# Patient Record
Sex: Male | Born: 1965 | Race: White | Hispanic: No | Marital: Married | Smoking: Former smoker
Health system: Southern US, Community
[De-identification: ages and names within clinical notes are randomized; demographics above are authoritative.]

## PROBLEM LIST (undated history)

## (undated) DIAGNOSIS — E785 Hyperlipidemia, unspecified: Secondary | ICD-10-CM

## (undated) DIAGNOSIS — I2510 Atherosclerotic heart disease of native coronary artery without angina pectoris: Secondary | ICD-10-CM

## (undated) DIAGNOSIS — K219 Gastro-esophageal reflux disease without esophagitis: Secondary | ICD-10-CM

## (undated) DIAGNOSIS — I10 Essential (primary) hypertension: Secondary | ICD-10-CM

## (undated) DIAGNOSIS — D649 Anemia, unspecified: Secondary | ICD-10-CM

## (undated) HISTORY — DX: Hyperlipidemia, unspecified: E78.5

## (undated) HISTORY — PX: NO PAST SURGERIES: SHX2092

## (undated) HISTORY — DX: Gastro-esophageal reflux disease without esophagitis: K21.9

## (undated) HISTORY — DX: Atherosclerotic heart disease of native coronary artery without angina pectoris: I25.10

## (undated) HISTORY — DX: Anemia, unspecified: D64.9

---
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
## 2012-02-01 DIAGNOSIS — Z8249 Family history of ischemic heart disease and other diseases of the circulatory system: Secondary | ICD-10-CM | POA: Insufficient documentation

## 2013-09-29 DIAGNOSIS — R931 Abnormal findings on diagnostic imaging of heart and coronary circulation: Secondary | ICD-10-CM | POA: Insufficient documentation

## 2016-04-01 DIAGNOSIS — R002 Palpitations: Secondary | ICD-10-CM | POA: Diagnosis not present

## 2016-04-01 DIAGNOSIS — R931 Abnormal findings on diagnostic imaging of heart and coronary circulation: Secondary | ICD-10-CM | POA: Diagnosis not present

## 2016-04-01 DIAGNOSIS — Z0000 Encounter for general adult medical examination without abnormal findings: Secondary | ICD-10-CM | POA: Diagnosis not present

## 2016-04-01 DIAGNOSIS — E7800 Pure hypercholesterolemia, unspecified: Secondary | ICD-10-CM | POA: Diagnosis not present

## 2016-06-19 DIAGNOSIS — E784 Other hyperlipidemia: Secondary | ICD-10-CM | POA: Diagnosis not present

## 2016-06-19 DIAGNOSIS — Z1389 Encounter for screening for other disorder: Secondary | ICD-10-CM | POA: Diagnosis not present

## 2016-06-19 DIAGNOSIS — I2584 Coronary atherosclerosis due to calcified coronary lesion: Secondary | ICD-10-CM | POA: Diagnosis not present

## 2016-06-19 DIAGNOSIS — Z6824 Body mass index (BMI) 24.0-24.9, adult: Secondary | ICD-10-CM | POA: Diagnosis not present

## 2017-02-17 DIAGNOSIS — Z0000 Encounter for general adult medical examination without abnormal findings: Secondary | ICD-10-CM | POA: Diagnosis not present

## 2017-02-17 DIAGNOSIS — E7849 Other hyperlipidemia: Secondary | ICD-10-CM | POA: Diagnosis not present

## 2017-02-17 DIAGNOSIS — Z125 Encounter for screening for malignant neoplasm of prostate: Secondary | ICD-10-CM | POA: Diagnosis not present

## 2017-02-24 DIAGNOSIS — F5221 Male erectile disorder: Secondary | ICD-10-CM | POA: Diagnosis not present

## 2017-02-24 DIAGNOSIS — Z1389 Encounter for screening for other disorder: Secondary | ICD-10-CM | POA: Diagnosis not present

## 2017-02-24 DIAGNOSIS — Z0000 Encounter for general adult medical examination without abnormal findings: Secondary | ICD-10-CM | POA: Diagnosis not present

## 2017-02-24 DIAGNOSIS — R7301 Impaired fasting glucose: Secondary | ICD-10-CM | POA: Diagnosis not present

## 2017-02-24 DIAGNOSIS — E7849 Other hyperlipidemia: Secondary | ICD-10-CM | POA: Diagnosis not present

## 2017-02-24 DIAGNOSIS — I2584 Coronary atherosclerosis due to calcified coronary lesion: Secondary | ICD-10-CM | POA: Diagnosis not present

## 2017-04-26 DIAGNOSIS — H5213 Myopia, bilateral: Secondary | ICD-10-CM | POA: Diagnosis not present

## 2017-09-16 DIAGNOSIS — R002 Palpitations: Secondary | ICD-10-CM | POA: Diagnosis not present

## 2017-09-16 DIAGNOSIS — F5221 Male erectile disorder: Secondary | ICD-10-CM | POA: Diagnosis not present

## 2017-09-16 DIAGNOSIS — I2584 Coronary atherosclerosis due to calcified coronary lesion: Secondary | ICD-10-CM | POA: Diagnosis not present

## 2017-09-16 DIAGNOSIS — R7301 Impaired fasting glucose: Secondary | ICD-10-CM | POA: Diagnosis not present

## 2017-09-16 DIAGNOSIS — E7849 Other hyperlipidemia: Secondary | ICD-10-CM | POA: Diagnosis not present

## 2017-12-22 DIAGNOSIS — Z6827 Body mass index (BMI) 27.0-27.9, adult: Secondary | ICD-10-CM | POA: Diagnosis not present

## 2017-12-22 DIAGNOSIS — E7849 Other hyperlipidemia: Secondary | ICD-10-CM | POA: Diagnosis not present

## 2017-12-22 DIAGNOSIS — R002 Palpitations: Secondary | ICD-10-CM | POA: Diagnosis not present

## 2017-12-22 DIAGNOSIS — R030 Elevated blood-pressure reading, without diagnosis of hypertension: Secondary | ICD-10-CM | POA: Diagnosis not present

## 2017-12-22 DIAGNOSIS — K219 Gastro-esophageal reflux disease without esophagitis: Secondary | ICD-10-CM | POA: Diagnosis not present

## 2017-12-31 DIAGNOSIS — Z0189 Encounter for other specified special examinations: Secondary | ICD-10-CM | POA: Diagnosis not present

## 2017-12-31 DIAGNOSIS — I10 Essential (primary) hypertension: Secondary | ICD-10-CM | POA: Diagnosis not present

## 2017-12-31 DIAGNOSIS — R0789 Other chest pain: Secondary | ICD-10-CM | POA: Diagnosis not present

## 2017-12-31 DIAGNOSIS — R002 Palpitations: Secondary | ICD-10-CM | POA: Diagnosis not present

## 2018-01-14 DIAGNOSIS — R0789 Other chest pain: Secondary | ICD-10-CM | POA: Diagnosis not present

## 2018-01-14 DIAGNOSIS — R002 Palpitations: Secondary | ICD-10-CM | POA: Diagnosis not present

## 2018-01-19 ENCOUNTER — Other Ambulatory Visit (HOSPITAL_COMMUNITY): Payer: Self-pay | Admitting: Cardiology

## 2018-01-19 DIAGNOSIS — R079 Chest pain, unspecified: Secondary | ICD-10-CM

## 2018-01-19 DIAGNOSIS — R9430 Abnormal result of cardiovascular function study, unspecified: Secondary | ICD-10-CM

## 2018-01-21 DIAGNOSIS — Z6826 Body mass index (BMI) 26.0-26.9, adult: Secondary | ICD-10-CM | POA: Diagnosis not present

## 2018-01-21 DIAGNOSIS — I10 Essential (primary) hypertension: Secondary | ICD-10-CM | POA: Diagnosis not present

## 2018-01-21 DIAGNOSIS — I2510 Atherosclerotic heart disease of native coronary artery without angina pectoris: Secondary | ICD-10-CM | POA: Diagnosis not present

## 2018-01-24 ENCOUNTER — Telehealth (HOSPITAL_COMMUNITY): Payer: Self-pay | Admitting: Emergency Medicine

## 2018-01-24 NOTE — Telephone Encounter (Signed)
error 

## 2018-01-24 NOTE — Telephone Encounter (Signed)
RN NAV called patient to ensure smooth procedure tomorrow; patient not aware to take metoprolol before scan, denies allergies to contrast, denies use of metformin or sexually enhancing drugs; pt states he uses [facility] for prescriptions; pt also instructed not to eat at least 4 hr prior and to drink water up to 1 hr prior; encouraged patient to call me back if any questions came up [place]

## 2018-01-25 ENCOUNTER — Ambulatory Visit (HOSPITAL_COMMUNITY)
Admission: RE | Admit: 2018-01-25 | Discharge: 2018-01-25 | Disposition: A | Discharge status: Home / Self Care | Payer: BLUE CROSS/BLUE SHIELD | Source: Ambulatory Visit | Attending: Cardiology | Admitting: Cardiology

## 2018-01-25 ENCOUNTER — Encounter (HOSPITAL_COMMUNITY): Payer: Self-pay

## 2018-01-25 DIAGNOSIS — R9430 Abnormal result of cardiovascular function study, unspecified: Secondary | ICD-10-CM | POA: Insufficient documentation

## 2018-01-25 DIAGNOSIS — R079 Chest pain, unspecified: Secondary | ICD-10-CM | POA: Diagnosis not present

## 2018-01-25 HISTORY — DX: Essential (primary) hypertension: I10

## 2018-01-25 MED ORDER — IOPAMIDOL (ISOVUE-370) INJECTION 76%
100.0000 mL | Freq: Once | INTRAVENOUS | Status: AC | PRN
  Administered 2018-01-25: 100 mL via INTRAVENOUS

## 2018-01-25 MED ORDER — NITROGLYCERIN 0.4 MG SL SUBL
0.8000 mg | SUBLINGUAL_TABLET | Freq: Once | SUBLINGUAL | Status: AC
  Administered 2018-01-25: 0.8 mg via SUBLINGUAL
  Filled 2018-01-25: qty 25

## 2018-01-25 MED ORDER — METOPROLOL TARTRATE 5 MG/5ML IV SOLN
5.0000 mg | INTRAVENOUS | Status: DC | PRN
  Administered 2018-01-25 (×2): 5 mg via INTRAVENOUS
  Filled 2018-01-25: qty 5

## 2018-01-25 MED ORDER — NITROGLYCERIN 0.4 MG SL SUBL
SUBLINGUAL_TABLET | SUBLINGUAL | Status: AC
  Administered 2018-01-25: 0.8 mg via SUBLINGUAL
  Filled 2018-01-25: qty 2

## 2018-01-25 MED ORDER — METOPROLOL TARTRATE 5 MG/5ML IV SOLN
INTRAVENOUS | Status: AC
  Administered 2018-01-25: 5 mg via INTRAVENOUS
  Filled 2018-01-25: qty 15

## 2018-01-25 NOTE — Progress Notes (Signed)
Tolerated CT without incident. Drank coffee and water. Ate Peanut butter crackers. Ambulated to exit steady gait.

## 2018-01-31 DIAGNOSIS — I25118 Atherosclerotic heart disease of native coronary artery with other forms of angina pectoris: Secondary | ICD-10-CM | POA: Diagnosis not present

## 2018-01-31 DIAGNOSIS — I10 Essential (primary) hypertension: Secondary | ICD-10-CM | POA: Diagnosis not present

## 2018-01-31 DIAGNOSIS — E785 Hyperlipidemia, unspecified: Secondary | ICD-10-CM | POA: Diagnosis not present

## 2018-01-31 DIAGNOSIS — R002 Palpitations: Secondary | ICD-10-CM | POA: Diagnosis not present

## 2018-02-18 DIAGNOSIS — I25118 Atherosclerotic heart disease of native coronary artery with other forms of angina pectoris: Secondary | ICD-10-CM | POA: Diagnosis not present

## 2018-03-10 DIAGNOSIS — I2510 Atherosclerotic heart disease of native coronary artery without angina pectoris: Secondary | ICD-10-CM | POA: Diagnosis not present

## 2018-03-10 DIAGNOSIS — I10 Essential (primary) hypertension: Secondary | ICD-10-CM | POA: Diagnosis not present

## 2018-03-10 DIAGNOSIS — R931 Abnormal findings on diagnostic imaging of heart and coronary circulation: Secondary | ICD-10-CM | POA: Diagnosis not present

## 2018-03-10 DIAGNOSIS — E785 Hyperlipidemia, unspecified: Secondary | ICD-10-CM | POA: Diagnosis not present

## 2018-03-18 DIAGNOSIS — R7301 Impaired fasting glucose: Secondary | ICD-10-CM | POA: Diagnosis not present

## 2018-03-18 DIAGNOSIS — R82998 Other abnormal findings in urine: Secondary | ICD-10-CM | POA: Diagnosis not present

## 2018-03-18 DIAGNOSIS — Z0000 Encounter for general adult medical examination without abnormal findings: Secondary | ICD-10-CM | POA: Diagnosis not present

## 2018-03-18 DIAGNOSIS — I10 Essential (primary) hypertension: Secondary | ICD-10-CM | POA: Diagnosis not present

## 2018-03-18 DIAGNOSIS — Z125 Encounter for screening for malignant neoplasm of prostate: Secondary | ICD-10-CM | POA: Diagnosis not present

## 2018-03-23 ENCOUNTER — Other Ambulatory Visit: Payer: Self-pay | Admitting: Cardiology

## 2018-03-23 DIAGNOSIS — Z1331 Encounter for screening for depression: Secondary | ICD-10-CM | POA: Diagnosis not present

## 2018-03-23 DIAGNOSIS — I25118 Atherosclerotic heart disease of native coronary artery with other forms of angina pectoris: Secondary | ICD-10-CM

## 2018-03-23 DIAGNOSIS — Z0000 Encounter for general adult medical examination without abnormal findings: Secondary | ICD-10-CM | POA: Diagnosis not present

## 2018-03-23 DIAGNOSIS — R7301 Impaired fasting glucose: Secondary | ICD-10-CM | POA: Diagnosis not present

## 2018-03-23 DIAGNOSIS — I2584 Coronary atherosclerosis due to calcified coronary lesion: Secondary | ICD-10-CM | POA: Diagnosis not present

## 2018-03-23 DIAGNOSIS — I10 Essential (primary) hypertension: Secondary | ICD-10-CM | POA: Diagnosis not present

## 2018-03-23 DIAGNOSIS — K219 Gastro-esophageal reflux disease without esophagitis: Secondary | ICD-10-CM | POA: Diagnosis not present

## 2018-04-16 ENCOUNTER — Other Ambulatory Visit: Payer: Self-pay | Admitting: Cardiology

## 2018-04-16 DIAGNOSIS — I25118 Atherosclerotic heart disease of native coronary artery with other forms of angina pectoris: Secondary | ICD-10-CM

## 2018-05-04 ENCOUNTER — Other Ambulatory Visit: Payer: Self-pay | Admitting: Cardiology

## 2018-05-04 DIAGNOSIS — I10 Essential (primary) hypertension: Secondary | ICD-10-CM

## 2018-05-13 ENCOUNTER — Other Ambulatory Visit: Payer: Self-pay

## 2018-05-13 DIAGNOSIS — I25118 Atherosclerotic heart disease of native coronary artery with other forms of angina pectoris: Secondary | ICD-10-CM

## 2018-05-13 MED ORDER — METOPROLOL SUCCINATE ER 25 MG PO TB24: ORAL_TABLET | ORAL | 1 refills | Status: DC

## 2018-06-11 ENCOUNTER — Other Ambulatory Visit: Payer: Self-pay | Admitting: Cardiology

## 2018-06-11 DIAGNOSIS — I25118 Atherosclerotic heart disease of native coronary artery with other forms of angina pectoris: Secondary | ICD-10-CM

## 2018-07-26 ENCOUNTER — Other Ambulatory Visit: Payer: Self-pay | Admitting: Cardiology

## 2018-07-26 DIAGNOSIS — I10 Essential (primary) hypertension: Secondary | ICD-10-CM

## 2018-08-04 ENCOUNTER — Other Ambulatory Visit: Payer: Self-pay | Admitting: Cardiology

## 2018-08-04 DIAGNOSIS — I25118 Atherosclerotic heart disease of native coronary artery with other forms of angina pectoris: Secondary | ICD-10-CM

## 2018-09-06 ENCOUNTER — Encounter: Payer: Self-pay | Admitting: Cardiology

## 2018-09-06 DIAGNOSIS — E7800 Pure hypercholesterolemia, unspecified: Secondary | ICD-10-CM | POA: Insufficient documentation

## 2018-09-06 DIAGNOSIS — I10 Essential (primary) hypertension: Secondary | ICD-10-CM

## 2018-09-06 HISTORY — DX: Essential (primary) hypertension: I10

## 2018-09-07 ENCOUNTER — Other Ambulatory Visit: Payer: Self-pay

## 2018-09-07 ENCOUNTER — Ambulatory Visit (INDEPENDENT_AMBULATORY_CARE_PROVIDER_SITE_OTHER): Payer: BC Managed Care – PPO | Admitting: Cardiology

## 2018-09-07 ENCOUNTER — Encounter: Payer: Self-pay | Admitting: Cardiology

## 2018-09-07 VITALS — BP 142/96 | HR 88 | Ht 70.0 in | Wt 184.0 lb

## 2018-09-07 DIAGNOSIS — I2510 Atherosclerotic heart disease of native coronary artery without angina pectoris: Secondary | ICD-10-CM

## 2018-09-07 DIAGNOSIS — I25118 Atherosclerotic heart disease of native coronary artery with other forms of angina pectoris: Secondary | ICD-10-CM

## 2018-09-07 DIAGNOSIS — I10 Essential (primary) hypertension: Secondary | ICD-10-CM | POA: Diagnosis not present

## 2018-09-07 MED ORDER — METOPROLOL SUCCINATE ER 50 MG PO TB24: ORAL_TABLET | ORAL | 2 refills | Status: DC

## 2018-09-07 NOTE — Progress Notes (Signed)
Follow up visit  Subjective:   Jeffrey Mcdaniel, male    DOB: 1966/01/19, 53 y.o.   MRN: [id number]   Chief Complaint  Patient presents with  . Coronary Artery Disease  . Follow-up    28mo    HPI  53y/o Caucasian male with coronary artery disease without angina, hypertension, controlled hyperlipidemia, family h/o premature CAD.  He has been doing well. He walks 3-4 miles 3 times/week at brisk pace, without any angina symptoms. He has noticed his blood pressure, especially diastolic, has been on the higher side.   Past Medical History:  Diagnosis Date  . HTN (hypertension) 09/06/2018  . Hypertension      Past Surgical History:  Procedure Laterality Date  . none       Social History   Socioeconomic History  . Marital status: Married    Spouse name: Not on file  . Number of children: Not on file  . Years of education: Not on file  . Highest education level: Not on file  Occupational History  . Not on file  Social Needs  . Financial resource strain: Not on file  . Food insecurity    Worry: Not on file    Inability: Not on file  . Transportation needs    Medical: Not on file    Non-medical: Not on file  Tobacco Use  . Smoking status: Not on file  Substance and Sexual Activity  . Alcohol use: Not on file  . Drug use: Not on file  . Sexual activity: Not on file  Lifestyle  . Physical activity    Days per week: Not on file    Minutes per session: Not on file  . Stress: Not on file  Relationships  . Social [place]: Not on file    Gets together: Not on file    Attends religious service: Not on file    Active member of club or organization: Not on file    Attends meetings of clubs or organizations: Not on file    Relationship status: Not on file  . Intimate partner violence    Fear of current or ex partner: Not on file    Emotionally abused: Not on file    Physically abused: Not on file    Forced sexual activity: Not on file  Other  Topics Concern  . Not on file  Social History Narrative  . Not on file     Family History  Problem Relation Age of Onset  . Heart disease Mother   . Heart attack Father   . Diabetes Father      Current Outpatient Medications on File Prior to Visit  Medication Sig Dispense Refill  . amLODipine (NORVASC) 5 MG tablet TAKE 1 TABLET BY MOUTH EVERY DAY 90 tablet 0  . aspirin EC 81 MG tablet Take 81 mg by mouth daily.    .Marland Kitchenatorvastatin (LIPITOR) 20 MG tablet Take 20 mg by mouth daily.    .Marland Kitchenezetimibe (ZETIA) 10 MG tablet TAKE 1 TABLET BY MOUTH EVERY DAY 30 tablet 1  . metoprolol succinate (TOPROL-XL) 25 MG 24 hr tablet TAKE 1 (ONE) TABLET ONCE DAILY IN THE EVENING 90 tablet 1  . omeprazole (PRILOSEC) 10 MG capsule Take 10 mg by mouth daily.     No current facility-administered medications on file prior to visit.     Cardiovascular studies:  EKG 09/07/2018: Sinus rhythm 78 bpm. RSR(V1) -nondiagnostic.  Echocardiogram 02/18/2018: Left ventricle cavity is normal in size. Normal global wall motion. Normal diastolic filling pattern. Calculated EF 65%. Left atrial cavity is mildly dilated at 4.4 cm. Structurally normal mitral valve with trace regurgitation. Mild tricuspid regurgitation. No evidence of pulmonary hypertension.  CT coronary 01/25/2018: 1. Calcium score 3277.  99th percentile for age and sex matched control. 2. Right dominant circulation. 3.  Moderate diffuse CAD.  At least moderate stenosis and mid RCA, proximal LAD, proximal ramus intermedius, and severe stenosis in occiput PDA.  Unable to process CT FFR as study is affected by motion.  Cardiac catheterization recommended.  Exercise Treadmill Stress Test 01/14/2018: Indication: FHx, palpitation, chest pressure The patient exercised on Bruce protocol for  10:00 min. Patient achieved  11.81 METS and reached HR  169 bpm, which is  99 % of maximum age-predicted HR.  Stress test terminated due to fatigue.   Exercise  capacity was normal. HR Response to Exercise: Appropriate. BP Response to Exercise: Resting hypertension- appropriate response. Chest Pain: none. Arrhythmias: none. Resting EKG demonstrates sinus rhyth, incomplete RBBB. ST Changes: With peak exercise there were 1 mm horizontal ST depressions in lateral leads V2-V6, 1.5 downsloping ST depressions in lead V2, recovered within 2 min during recovery.  EKG changes are equivocal for ischemia.  Overall Impression: Equicoval stress test. Recommendations:  Consider nuclear sress test or coronary CT angiogram  Recent labs: 12/22/2017: H/H 13.7/44.2.  MCV 83.  Platelets 212.   Glucose 98.  BUN/creatinine 17/0.9.  eGFR 89/108.  Sodium 139, potassium 4.0.  Rest of the CMP normal Cholesterol 198, triglycerides 160, HDL 73, LDL 93. TSH 1.2 normal   Review of Systems  Constitution: Negative for decreased appetite, malaise/fatigue, weight gain and weight loss.  HENT: Negative for congestion.   Eyes: Negative for visual disturbance.  Cardiovascular: Negative for chest pain, dyspnea on exertion, leg swelling, palpitations and syncope.  Respiratory: Negative for cough.   Endocrine: Negative for cold intolerance.  Hematologic/Lymphatic: Does not bruise/bleed easily.  Skin: Negative for itching and rash.  Musculoskeletal: Negative for myalgias.  Gastrointestinal: Negative for abdominal pain, nausea and vomiting.  Genitourinary: Negative for dysuria.  Neurological: Negative for dizziness and weakness.  Psychiatric/Behavioral: The patient is not nervous/anxious.   All other systems reviewed and are negative.       Vitals:   09/07/18 1533  BP: (!) 142/96  Pulse: 88  SpO2: 96%    Body mass index is 26.4 kg/m. Filed Weights   09/07/18 1533  Weight: 184 lb (83.5 kg)     Objective:   Physical Exam  Constitutional: He is oriented to person, place, and time. He appears well-developed and well-nourished. No distress.  HENT:  Head:  Normocephalic and atraumatic.  Eyes: Pupils are equal, round, and reactive to light. Conjunctivae are normal.  Neck: No JVD present.  Cardiovascular: Normal rate, regular rhythm and intact distal pulses.  Pulmonary/Chest: Effort normal and breath sounds normal. He has no wheezes. He has no rales.  Abdominal: Soft. Bowel sounds are normal. There is no rebound.  Musculoskeletal:        General: No edema.  Lymphadenopathy:    He has no cervical adenopathy.  Neurological: He is alert and oriented to person, place, and time. No cranial nerve deficit.  Skin: Skin is warm and dry.  Psychiatric: He has a normal mood and affect.  Nursing note and vitals reviewed.         Assessment & Recommendations:   53 y/o Caucasian male with  coronary artery disease without angina, hypertension, controlled hyperlipidemia, family h/o premature CAD.  CAD: Seen on coronary CTA (01/2018) that was ordered after atypical chest pain and abnormal exercise treadmill stress test, albeit with good exercise capacity and no chest pain. I further discussed the CTA with Dr. Anell Barr cardiologist who read the CTA. He has a small nondominant LCx that is probably occluded. Otherwise, the disease is at best mild to moderate without any interventional targets. Moreover, he is asymptomatic at this point.  Continue aggressive medical management with aspirin, statin + zetia, metoprolol and amlodipine.  Hypertension: Suboptimal control. Increased metoprolol succinate to 50 mg daily.   Hyperlipidemia: Continue lipitor 40 mg daily and Zetia 10 mg daily. Will check lipid panel.  F/u in 6 months.     [name], MD [facility] Pager: [phone] Office: [phone] If no answer Cell [phone]

## 2018-09-21 DIAGNOSIS — E785 Hyperlipidemia, unspecified: Secondary | ICD-10-CM | POA: Diagnosis not present

## 2018-09-21 DIAGNOSIS — I2584 Coronary atherosclerosis due to calcified coronary lesion: Secondary | ICD-10-CM | POA: Diagnosis not present

## 2018-09-21 DIAGNOSIS — K219 Gastro-esophageal reflux disease without esophagitis: Secondary | ICD-10-CM | POA: Diagnosis not present

## 2018-09-21 DIAGNOSIS — I10 Essential (primary) hypertension: Secondary | ICD-10-CM | POA: Diagnosis not present

## 2018-10-01 ENCOUNTER — Other Ambulatory Visit: Payer: Self-pay | Admitting: Cardiology

## 2018-10-01 DIAGNOSIS — I25118 Atherosclerotic heart disease of native coronary artery with other forms of angina pectoris: Secondary | ICD-10-CM

## 2018-11-03 ENCOUNTER — Other Ambulatory Visit: Payer: Self-pay | Admitting: Cardiology

## 2018-11-03 DIAGNOSIS — I25118 Atherosclerotic heart disease of native coronary artery with other forms of angina pectoris: Secondary | ICD-10-CM

## 2018-12-01 ENCOUNTER — Other Ambulatory Visit: Payer: Self-pay | Admitting: Cardiology

## 2018-12-01 DIAGNOSIS — I25118 Atherosclerotic heart disease of native coronary artery with other forms of angina pectoris: Secondary | ICD-10-CM

## 2018-12-31 ENCOUNTER — Other Ambulatory Visit: Payer: Self-pay | Admitting: Cardiology

## 2018-12-31 DIAGNOSIS — I25118 Atherosclerotic heart disease of native coronary artery with other forms of angina pectoris: Secondary | ICD-10-CM

## 2019-02-03 ENCOUNTER — Other Ambulatory Visit: Payer: Self-pay | Admitting: Cardiology

## 2019-02-03 DIAGNOSIS — I25118 Atherosclerotic heart disease of native coronary artery with other forms of angina pectoris: Secondary | ICD-10-CM

## 2019-02-21 ENCOUNTER — Other Ambulatory Visit: Payer: Self-pay

## 2019-02-21 ENCOUNTER — Ambulatory Visit (INDEPENDENT_AMBULATORY_CARE_PROVIDER_SITE_OTHER): Payer: BC Managed Care – PPO | Admitting: Cardiology

## 2019-02-21 ENCOUNTER — Encounter: Payer: Self-pay | Admitting: Cardiology

## 2019-02-21 VITALS — BP 139/98 | HR 79 | Temp 97.8°F | Ht 70.0 in | Wt 193.5 lb

## 2019-02-21 DIAGNOSIS — I2510 Atherosclerotic heart disease of native coronary artery without angina pectoris: Secondary | ICD-10-CM | POA: Insufficient documentation

## 2019-02-21 DIAGNOSIS — I10 Essential (primary) hypertension: Secondary | ICD-10-CM

## 2019-02-21 MED ORDER — AMLODIPINE BESYLATE 5 MG PO TABS: 10.0000 mg | ORAL_TABLET | Freq: Every day | ORAL | 2 refills | Status: DC

## 2019-02-21 NOTE — Progress Notes (Signed)
Follow up visit  Subjective:   Jeffrey Mcdaniel, male    DOB: 1965-06-19, 54 y.o.   MRN: [id number]   Chief Complaint  Patient presents with  . Coronary Artery Disease    pt c/o blood pressure being high     HPI  54 y/o Caucasian male with coronary artery disease, hypertension, controlled hyperlipidemia, family h/o premature CAD.  Patient has noticed increased blood pressure, around 150/100 for the last few weeks. He has not made any recent negative changes to his diet and lifestyle. He is compliant with his medical therapy. He is exercising regularly, without chest pain, shortness of breath, palpitations, leg edema, orthopnea, PND, TIA/syncope.  Current Outpatient Medications on File Prior to Visit  Medication Sig Dispense Refill  . amLODipine (NORVASC) 5 MG tablet TAKE 1 TABLET BY MOUTH EVERY DAY 90 tablet 0  . aspirin EC 81 MG tablet Take 81 mg by mouth daily.    Marland Kitchen atorvastatin (LIPITOR) 40 MG tablet Take 40 mg by mouth daily.     Marland Kitchen ezetimibe (ZETIA) 10 MG tablet TAKE 1 TABLET BY MOUTH EVERY DAY 30 tablet 1  . metoprolol succinate (TOPROL-XL) 50 MG 24 hr tablet TAKE 1 (ONE) TABLET ONCE DAILY IN THE EVENING 90 tablet 2  . nitroGLYCERIN (NITROSTAT) 0.4 MG SL tablet Place 0.4 mg under the tongue every 5 (five) minutes as needed for chest pain.    Marland Kitchen Omeprazole 20 MG TBDD Take 20 mg by mouth daily.     . tadalafil (CIALIS) 10 MG tablet as needed.     No current facility-administered medications on file prior to visit.    Cardiovascular studies:   EKG 02/21/2019: Sinus rhythm 69 bpm. Incomplete RBBB. No change compared to previous.  Echocardiogram 02/18/2018: Left ventricle cavity is normal in size. Normal global wall motion. Normal diastolic filling pattern. Calculated EF 65%. Left atrial cavity is mildly dilated at 4.4 cm. Structurally normal mitral valve with trace regurgitation. Mild tricuspid regurgitation. No evidence of pulmonary hypertension.  CT coronary  01/25/2018: 1. Calcium score 3277.  99th percentile for age and sex matched control. 2. Right dominant circulation. 3.  Moderate diffuse CAD.  At least moderate stenosis and mid RCA, proximal LAD, proximal ramus intermedius, and severe stenosis in occiput PDA.  Unable to process CT FFR as study is affected by motion.  Cardiac catheterization recommended.  Exercise Treadmill Stress Test 01/14/2018: Indication: FHx, palpitation, chest pressure The patient exercised on Bruce protocol for  10:00 min. Patient achieved  11.81 METS and reached HR  169 bpm, which is  99 % of maximum age-predicted HR.  Stress test terminated due to fatigue.   Exercise capacity was normal. HR Response to Exercise: Appropriate. BP Response to Exercise: Resting hypertension- appropriate response. Chest Pain: none. Arrhythmias: none. Resting EKG demonstrates sinus rhyth, incomplete RBBB. ST Changes: With peak exercise there were 1 mm horizontal ST depressions in lateral leads V2-V6, 1.5 downsloping ST depressions in lead V2, recovered within 2 min during recovery.  EKG changes are equivocal for ischemia.  Overall Impression: Equicoval stress test. Recommendations:  Consider nuclear sress test or coronary CT angiogram  Recent labs: 12/22/2017: H/H 13.7/44.2.  MCV 83.  Platelets 212.   Glucose 98.  BUN/creatinine 17/0.9.  eGFR 89/108.  Sodium 139, potassium 4.0.  Rest of the CMP normal Cholesterol 198, triglycerides 160, HDL 73, LDL 93. TSH 1.2 normal   Review of Systems  Cardiovascular: Negative for chest pain, dyspnea on exertion, leg swelling, palpitations and syncope.  Vitals:   02/21/19 1014 02/21/19 1030  BP: (!) 157/97 (!) 139/98  Pulse: 77 79  Temp: 97.8 F (36.6 C)   SpO2: 100%      Body mass index is 27.76 kg/m. Filed Weights   02/21/19 1014  Weight: 193 lb 8 oz (87.8 kg)     Objective:   Physical Exam  Constitutional: He appears well-developed and well-nourished.  Neck: No JVD  present.  Cardiovascular: Normal rate, regular rhythm, normal heart sounds and intact distal pulses.  No murmur heard. Pulmonary/Chest: Effort normal and breath sounds normal. He has no wheezes. He has no rales.  Musculoskeletal:        General: No edema.  Nursing note and vitals reviewed.       Assessment & Recommendations:   54 y/o Caucasian male with coronary artery disease without angina, hypertension, controlled hyperlipidemia, family h/o premature CAD.  CAD: Moderate nonobstructive. No symptoms of angina. Continue medical management with aspirin, statin + zetia, metoprolol and amlodipine.  Hypertension: Suboptimal control. Increased amlodipine to 10 mg daily. Continue metoprolol succinate to 50 mg daily.   Hyperlipidemia: Continue lipitor 40 mg daily and Zetia 10 mg daily. Will check lipid panel, lipoprotein (a).  Virtual f/u in 4 weeks.  Jeffrey Mormon, MD [facility] Pager: [phone] Office: [phone] If no answer Cell [phone]

## 2019-02-23 DIAGNOSIS — I10 Essential (primary) hypertension: Secondary | ICD-10-CM | POA: Diagnosis not present

## 2019-02-23 DIAGNOSIS — I2510 Atherosclerotic heart disease of native coronary artery without angina pectoris: Secondary | ICD-10-CM | POA: Diagnosis not present

## 2019-02-24 LAB — CBC
Hematocrit: 38.4 % (ref 37.5–51.0)
Hemoglobin: 12.5 g/dL — ABNORMAL LOW (ref 13.0–17.7)
MCH: 24 pg — ABNORMAL LOW (ref 26.6–33.0)
MCHC: 32.6 g/dL (ref 31.5–35.7)
MCV: 74 fL — ABNORMAL LOW (ref 79–97)
Platelets: 281 10*3/uL (ref 150–450)
RBC: 5.21 x10E6/uL (ref 4.14–5.80)
RDW: 15.7 % — ABNORMAL HIGH (ref 11.6–15.4)
WBC: 5 10*3/uL (ref 3.4–10.8)

## 2019-02-24 LAB — COMPREHENSIVE METABOLIC PANEL
ALT: 45 IU/L — ABNORMAL HIGH (ref 0–44)
AST: 44 IU/L — ABNORMAL HIGH (ref 0–40)
Albumin/Globulin Ratio: 1.8 (ref 1.2–2.2)
Albumin: 4.7 g/dL (ref 3.8–4.9)
Alkaline Phosphatase: 82 IU/L (ref 39–117)
BUN/Creatinine Ratio: 16 (ref 9–20)
BUN: 13 mg/dL (ref 6–24)
Bilirubin Total: 0.5 mg/dL (ref 0.0–1.2)
CO2: 24 mmol/L (ref 20–29)
Calcium: 9.6 mg/dL (ref 8.7–10.2)
Chloride: 98 mmol/L (ref 96–106)
Creatinine, Ser: 0.83 mg/dL (ref 0.76–1.27)
GFR calc Af Amer: 117 mL/min/{1.73_m2} (ref >59)
GFR calc non Af Amer: 101 mL/min/{1.73_m2} (ref >59)
Globulin, Total: 2.6 g/dL (ref 1.5–4.5)
Glucose: 107 mg/dL — ABNORMAL HIGH (ref 65–99)
Potassium: 4.7 mmol/L (ref 3.5–5.2)
Sodium: 135 mmol/L (ref 134–144)
Total Protein: 7.3 g/dL (ref 6.0–8.5)

## 2019-02-24 LAB — TSH: TSH: 1.84 u[IU]/mL (ref 0.450–4.500)

## 2019-02-24 LAB — LIPOPROTEIN A (LPA): Lipoprotein (a): 589.5 nmol/L — ABNORMAL HIGH (ref <75.0)

## 2019-03-01 ENCOUNTER — Other Ambulatory Visit: Payer: Self-pay | Admitting: Cardiology

## 2019-03-01 ENCOUNTER — Telehealth: Payer: Self-pay

## 2019-03-01 DIAGNOSIS — E782 Mixed hyperlipidemia: Secondary | ICD-10-CM

## 2019-03-01 DIAGNOSIS — I25118 Atherosclerotic heart disease of native coronary artery with other forms of angina pectoris: Secondary | ICD-10-CM

## 2019-03-01 NOTE — Telephone Encounter (Signed)
Please order and ask him to get it checked before his visit with me on 2/3.

## 2019-03-01 NOTE — Telephone Encounter (Signed)
-----   Message from [facility], MD sent at 02/27/2019  8:33 AM EST ----- Reviewed lipoprotein (a), BMP, CBC, TSH results. Please check if he also had lipid panel checked. Will discuss all results at upcoming office visit.  Thanks MJP

## 2019-03-01 NOTE — Telephone Encounter (Signed)
Patient did not have Lipid panel drawn.

## 2019-03-01 NOTE — Telephone Encounter (Signed)
Patient will go and have lipids drawn on 03/03/19

## 2019-03-02 DIAGNOSIS — E782 Mixed hyperlipidemia: Secondary | ICD-10-CM | POA: Diagnosis not present

## 2019-03-03 LAB — LIPID PANEL
Chol/HDL Ratio: 2 ratio (ref 0.0–5.0)
Cholesterol, Total: 199 mg/dL (ref 100–199)
HDL: 98 mg/dL (ref >39)
LDL Chol Calc (NIH): 83 mg/dL (ref 0–99)
Triglycerides: 106 mg/dL (ref 0–149)
VLDL Cholesterol Cal: 18 mg/dL (ref 5–40)

## 2019-03-09 ENCOUNTER — Ambulatory Visit: Payer: Self-pay | Admitting: Cardiology

## 2019-03-15 ENCOUNTER — Telehealth: Payer: BC Managed Care – PPO | Admitting: Cardiology

## 2019-03-15 ENCOUNTER — Encounter: Payer: Self-pay | Admitting: Cardiology

## 2019-03-15 VITALS — BP 125/85 | HR 70

## 2019-03-15 DIAGNOSIS — I2510 Atherosclerotic heart disease of native coronary artery without angina pectoris: Secondary | ICD-10-CM

## 2019-03-15 DIAGNOSIS — E7841 Elevated Lipoprotein(a): Secondary | ICD-10-CM | POA: Diagnosis not present

## 2019-03-15 DIAGNOSIS — I10 Essential (primary) hypertension: Secondary | ICD-10-CM | POA: Diagnosis not present

## 2019-03-15 DIAGNOSIS — E782 Mixed hyperlipidemia: Secondary | ICD-10-CM | POA: Diagnosis not present

## 2019-03-15 MED ORDER — AMLODIPINE BESYLATE 10 MG PO TABS: 10.0000 mg | ORAL_TABLET | Freq: Every day | ORAL | 3 refills | Status: DC

## 2019-03-15 MED ORDER — REPATHA 140 MG/ML ~~LOC~~ SOSY: 140.0000 mg | PREFILLED_SYRINGE | SUBCUTANEOUS | 11 refills | Status: DC

## 2019-03-15 NOTE — Progress Notes (Signed)
Follow up visit  Subjective:   Jeffrey Mcdaniel, male    DOB: [birth year], 54 y.o.   MRN: [id number]   I connected with the patient on 03/15/19 by a video enabled telemedicine application and verified that I am speaking with the correct person using two identifiers.     I discussed the limitations of evaluation and management by telemedicine and the availability of in person appointments. The patient expressed understanding and agreed to proceed.   This visit type was conducted due to national recommendations for restrictions regarding the COVID-19 Pandemic (e.g. social distancing).  This format is felt to be most appropriate for this patient at this time.  All issues noted in this document were discussed and addressed.  No physical exam was performed (except for noted visual exam findings with Tele health visits).  The patient has consented to conduct a Tele health visit and understands insurance will be billed.   Chief Complaint  Patient presents with  . Coronary Artery Disease  . Follow-up    3 weeks  . Results    labs   54 y/o Caucasian male with coronary artery disease, hypertension, controlled hyperlipidemia, family h/o premature CAD.  Since increasing amlodipine to 10 mg daily, his blood pressure is better controlled.  He is going to the gym twice a week.  On other days, he is walking up to 3 miles.  He has also recently started weight training. He denies chest pain, shortness of breath, palpitations, leg edema, orthopnea, PND, TIA/syncope.  I reviewed recent lipid panel and lipoprotein a results with the patient, details below.  Current Outpatient Medications on File Prior to Visit  Medication Sig Dispense Refill  . amLODipine (NORVASC) 5 MG tablet Take 2 tablets (10 mg total) by mouth daily. (Patient taking differently: Take 5 mg by mouth daily. ) 120 tablet 2  . aspirin EC 81 MG tablet Take 81 mg by mouth daily.    Marland Kitchen atorvastatin (LIPITOR) 40 MG tablet Take 40 mg by mouth daily.      Marland Kitchen ezetimibe (ZETIA) 10 MG tablet TAKE 1 TABLET BY MOUTH EVERY DAY 30 tablet 1  . metoprolol succinate (TOPROL-XL) 50 MG 24 hr tablet TAKE 1 (ONE) TABLET ONCE DAILY IN THE EVENING 90 tablet 2  . nitroGLYCERIN (NITROSTAT) 0.4 MG SL tablet Place 0.4 mg under the tongue every 5 (five) minutes as needed for chest pain.    Marland Kitchen Omeprazole 20 MG TBDD Take 20 mg by mouth daily.     . tadalafil (CIALIS) 10 MG tablet as needed.     No current facility-administered medications on file prior to visit.    Cardiovascular studies:   EKG 02/21/2019: Sinus rhythm 69 bpm. Incomplete RBBB. No change compared to previous.  Echocardiogram 02/18/2018: Left ventricle cavity is normal in size. Normal global wall motion. Normal diastolic filling pattern. Calculated EF 65%. Left atrial cavity is mildly dilated at 4.4 cm. Structurally normal mitral valve with trace regurgitation. Mild tricuspid regurgitation. No evidence of pulmonary hypertension.  CT coronary 01/25/2018: 1. Calcium score 3277.  99th percentile for age and sex matched control. 2. Right dominant circulation. 3.  Moderate diffuse CAD.  At least moderate stenosis and mid RCA, proximal LAD, proximal ramus intermedius, and severe stenosis in occiput PDA.  Unable to process CT FFR as study is affected by motion.  Cardiac catheterization recommended.  Exercise Treadmill Stress Test 01/14/2018: Indication: FHx, palpitation, chest pressure The patient exercised on Bruce protocol for  10:00 min. Patient achieved  11.81 METS and reached HR  169 bpm, which is  99 % of maximum age-predicted HR.  Stress test terminated due to fatigue.   Exercise capacity was normal. HR Response to Exercise: Appropriate. BP Response to Exercise: Resting hypertension- appropriate response. Chest Pain: none. Arrhythmias: none. Resting EKG demonstrates sinus rhyth, incomplete RBBB. ST Changes: With peak exercise there were 1 mm horizontal ST depressions in lateral leads  V2-V6, 1.5 downsloping ST depressions in lead V2, recovered within 2 min during recovery.  EKG changes are equivocal for ischemia.  Overall Impression: Equicoval stress test. Recommendations:  Consider nuclear sress test or coronary CT angiogram  Recent labs: 03/02/19: Cholesterol 199, triglycerides 106, HDL 98, LDL 83. Lipoprotein a 589  12/22/2017: H/H 13.7/44.2.  MCV 83.  Platelets 212.   Glucose 98.  BUN/creatinine 17/0.9.  eGFR 89/108.  Sodium 139, potassium 4.0.  Rest of the CMP normal Cholesterol 198, triglycerides 160, HDL 73, LDL 93. TSH 1.2 normal   Review of Systems  Cardiovascular: Negative for chest pain, dyspnea on exertion, leg swelling, palpitations and syncope.        Vitals:   03/15/19 1357  BP: 125/85  Pulse: 70     Objective:   Physical Exam  Constitutional: He appears well-developed and well-nourished.  Neck: No JVD present.  Cardiovascular: Normal rate, regular rhythm, normal heart sounds and intact distal pulses.  No murmur heard. Pulmonary/Chest: Effort normal and breath sounds normal. He has no wheezes. He has no rales.  Musculoskeletal:        General: No edema.  Nursing note and vitals reviewed.       Assessment & Recommendations:   54 y/o Caucasian male with coronary artery disease without angina, hypertension, controlled hyperlipidemia, family h/o premature CAD.  CAD: Moderate nonobstructive. No symptoms of angina. Continue medical management with aspirin, statin + zetia, metoprolol and amlodipine. See lipid management below.  Hypertension: Better controlled on amlodipine 10, metoprolol succinate 50.    Hyperlipidemia: LDL 83.  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  Currently, there are no targeted therapies available for reducing lipoprotein a.  With his coronary artery disease and elevated calcium score, I would like to reduce his LDL to 50 or below, and lipoprotein a 100 or below.  In addition to Lipitor and Zetia, I  recommend starting Repatha 140 mg every 14 days. Repeat lipid panel and lipoprotein a in 6 months.  Follow-up in 6 months.  [name], MD [facility] Pager: [phone] Office: [phone] If no answer Cell [phone]

## 2019-03-22 DIAGNOSIS — D509 Iron deficiency anemia, unspecified: Secondary | ICD-10-CM | POA: Diagnosis not present

## 2019-03-22 DIAGNOSIS — E7849 Other hyperlipidemia: Secondary | ICD-10-CM | POA: Diagnosis not present

## 2019-03-22 DIAGNOSIS — Z125 Encounter for screening for malignant neoplasm of prostate: Secondary | ICD-10-CM | POA: Diagnosis not present

## 2019-03-22 DIAGNOSIS — Z0000 Encounter for general adult medical examination without abnormal findings: Secondary | ICD-10-CM | POA: Diagnosis not present

## 2019-03-22 DIAGNOSIS — R7301 Impaired fasting glucose: Secondary | ICD-10-CM | POA: Diagnosis not present

## 2019-03-23 DIAGNOSIS — D509 Iron deficiency anemia, unspecified: Secondary | ICD-10-CM | POA: Diagnosis not present

## 2019-03-28 DIAGNOSIS — E785 Hyperlipidemia, unspecified: Secondary | ICD-10-CM | POA: Diagnosis not present

## 2019-03-28 DIAGNOSIS — D509 Iron deficiency anemia, unspecified: Secondary | ICD-10-CM | POA: Diagnosis not present

## 2019-03-28 DIAGNOSIS — I2510 Atherosclerotic heart disease of native coronary artery without angina pectoris: Secondary | ICD-10-CM | POA: Diagnosis not present

## 2019-03-28 DIAGNOSIS — Z1331 Encounter for screening for depression: Secondary | ICD-10-CM | POA: Diagnosis not present

## 2019-03-28 DIAGNOSIS — Z0000 Encounter for general adult medical examination without abnormal findings: Secondary | ICD-10-CM | POA: Diagnosis not present

## 2019-03-28 DIAGNOSIS — I10 Essential (primary) hypertension: Secondary | ICD-10-CM | POA: Diagnosis not present

## 2019-03-29 ENCOUNTER — Other Ambulatory Visit: Payer: Self-pay | Admitting: Cardiology

## 2019-03-29 DIAGNOSIS — I25118 Atherosclerotic heart disease of native coronary artery with other forms of angina pectoris: Secondary | ICD-10-CM

## 2019-04-02 NOTE — Progress Notes (Signed)
04/02/2019 Jeffrey Mcdaniel [id number] 1965/09/12   CHIEF COMPLAINT: Anemia   HISTORY OF PRESENT ILLNESS:  Jeffrey Mcdaniel is a 54 year old male with a past medical history of coronary artery disease, hypertension, hyperlipidemia and GERD. He was referred to our office by Dr. Forde Dandy for further evaluation regarding iron deficiency anemia. Laboratory studies done 03/23/2019 showed Hg 11.6. HCT 40.2. Iron 29. Iron saturation 7% and Ferritin 6.9. He is asymptomatic. He denies having any fatigue, chest pain or shortness of breath. He has a history of GERD, primarily had heartburn which resolved after he started taking Omeprazole 35m QD 1 1/2 to 2 years ago. No dysphagia or upper abdominal pain. No lower abdominal pain. He is passing a normal formed brown bowel movement once daily. No rectal bleeding or melena. Never had a screening colonoscopy. No weight loss. No fever, sweats or chills. No family history of gastric or colon cancer. He exercises on a regular basis, works out on the [employer] few days weekly and plays golf on a regular basis. He walked 7 miles yesterday without difficulty.   Labs 03/23/2019: WBC 4.3. Hg 11.6. HCT HCT 40.2 ((58592. MCV 79.1. PLT 246. Iron 29. Iron Saturation 7%. TIBC 412. Ferritin 6.9. BUN 12. Cr. 0.8. T. Bili 0.4. Alk phos 59. AST 27. ALT 26.  HgA1c 5.5%.  Labs 02/23/2019: WBC 5.0. Hg 12.5. HCT 38.4. PLT 281. AST 44. ALT 45. BUN 13. Cr. 0.83.  T. Bili 0.5. Alk phos 82.   Labs 03/18/2018: WBC 4.9. Hg 14.4. HCT 44.9. MCV 84.5. PLT 198.   Echocardiogram 02/18/2018: Left ventricle cavity is normal in size. Normal global wall motion. Normal diastolic filling pattern. Calculated EF 65%. Left atrial cavity is mildly dilated at 4.4 cm. Structurally normal mitral valve with trace regurgitation. Mild tricuspid regurgitation. No evidence of pulmonary hypertension.  CT coronary 01/25/2018: 1. Calcium score 3277. 99th percentile for age and sex matched control. 2.  Right dominant circulation. 3. Moderate diffuse CAD. At least moderate stenosis and mid RCA, proximal LAD, proximal ramus intermedius, and severe stenosis in occiput PDA. Unable to process CT FFR as study is affected by motion. CAD was treated medically without further intervention.   Exercise Treadmill Stress Test 01/14/2018: Indication: FHx, palpitation, chest pressure The patient exercised on Bruce protocol for 10:00 min. Patient achieved 11.81 METS and reached HR 169 bpm, which is 99 % of maximum age-predicted HR. Stress test terminated due to fatigue. EKG changes are equivocal for ischemia. Overall Impression: Equicoval stress test.   Past Medical History:  Diagnosis Date  . GERD (gastroesophageal reflux disease)   . HTN (hypertension) 09/06/2018  . Hyperlipidemia   . Hypertension    Past Surgical History:  Procedure Laterality Date  . none      Family History: Father died age 2311MI. Mother 718history of CABG 6 to 7 years ago, arthritis. Sisters x 2 alive and well.   Social History: Married. Twin sons age 2957and daughter age 53  Quit smoking 1990's. He drinks 2 glasses of wine or beers daily. No drug use.     Outpatient Encounter Medications as of 04/03/2019  Medication Sig  . amLODipine (NORVASC) 10 MG tablet Take 1 tablet (10 mg total) by mouth daily.  .Marland Kitchenaspirin EC 81 MG tablet Take 81 mg by mouth daily.  .Marland Kitchenatorvastatin (LIPITOR) 40 MG tablet Take 40 mg by mouth daily.   . Evolocumab (REPATHA) 140 MG/ML SOSY Inject 140 mg into  the skin every 14 (fourteen) days.  Marland Kitchen ezetimibe (ZETIA) 10 MG tablet TAKE 1 TABLET BY MOUTH EVERY DAY  . metoprolol succinate (TOPROL-XL) 50 MG 24 hr tablet TAKE 1 (ONE) TABLET ONCE DAILY IN THE EVENING  . nitroGLYCERIN (NITROSTAT) 0.4 MG SL tablet Place 0.4 mg under the tongue every 5 (five) minutes as needed for chest pain.  Marland Kitchen Omeprazole 20 MG TBDD Take 20 mg by mouth daily.   . tadalafil (CIALIS) 10 MG tablet as needed.   No  facility-administered encounter medications on file as of 04/03/2019.   No Known Allergies  REVIEW OF SYSTEMS:   Gen: Denies fever, sweats or chills. No weight loss.  CV: Denies chest pain, palpitations or edema. Resp: Denies cough, shortness of breath of hemoptysis.  GI: Denies heartburn, dysphagia, stomach or lower abdominal pain. No diarrhea or constipation.  GU : Denies urinary burning, blood in urine, increased urinary frequency or incontinence. MS: Denies joint pain, muscles aches or weakness. Derm: Denies rash, itchiness, skin lesions or unhealing ulcers. Psych: Denies depression, anxiety, memory loss, suicidal ideation and confusion. Heme: Denies bruising, bleeding. Neuro:  Denies headaches, dizziness or paresthesias. Endo:  Denies any problems with DM, thyroid or adrenal function.   PHYSICAL EXAM: There were no vitals taken for this visit. General: Well developed 54 year old male  in no acute distress. Head: Normocephalic and atraumatic. Eyes:  Sclerae non-icteric, conjunctive pink. Ears: Normal auditory acuity. Neck: Supple, no lymphadenopathy or thyromegaly.  Lungs: Clear bilaterally to auscultation without wheezes, crackles or rhonchi. Heart: Regular rate and rhythm. No murmur, rub or gallop appreciated.  Abdomen: Soft, nontender, non distended. No masses. No hepatosplenomegaly. Normoactive bowel sounds x 4 quadrants.  Rectal: Patient declined exam, to complete heme slides and proceed with a colonoscopy.  Musculoskeletal: Symmetrical with no gross deformities. Skin: Warm and dry. No rash or lesions on visible extremities. Extremities: No edema. Neurological: Alert oriented x 4, no focal deficits.  Psychological:  Alert and cooperative. Normal mood and affect.  ASSESSMENT AND PLAN:  [birth year]. 54 year old male with Iron deficiency anemia -EGD and colonoscopy benefits and risks discussed including risk with sedation, risk of bleeding, perforation and infection. EGD to include  duodenal biopsies to rule out celiac disease.  -Heme slides, complete before starting Ferrous Sulfate. -Ferrous Sulfate 336m one tab by mouth once daily. Patient will not take for 1 week prior to EGD/colonoscopy.  -Repeat CBC, iron, iron saturation, TIBC and Ferritin level with IgA and  tTG levels in 4 weeks. -If EGD and colonoscopy negative, a small bowel capsule endoscopy will be ordered.  2. History of coronary artery disease followed by cardiologist Dr. MVernell Leep He is on ASA 875mdaily. Metoprolol and Lipitor.   3. HTN   4. Mildly elevated LFTs -repeat hepatic panel in 4 weeks     CC:  SoReynold BowenMD

## 2019-04-03 ENCOUNTER — Ambulatory Visit: Payer: BC Managed Care – PPO | Admitting: Nurse Practitioner

## 2019-04-03 ENCOUNTER — Encounter: Payer: Self-pay | Admitting: General Surgery

## 2019-04-03 ENCOUNTER — Encounter: Payer: Self-pay | Admitting: Nurse Practitioner

## 2019-04-03 DIAGNOSIS — Z01818 Encounter for other preprocedural examination: Secondary | ICD-10-CM

## 2019-04-03 DIAGNOSIS — K219 Gastro-esophageal reflux disease without esophagitis: Secondary | ICD-10-CM | POA: Diagnosis not present

## 2019-04-03 DIAGNOSIS — R7989 Other specified abnormal findings of blood chemistry: Secondary | ICD-10-CM | POA: Diagnosis not present

## 2019-04-03 DIAGNOSIS — D508 Other iron deficiency anemias: Secondary | ICD-10-CM

## 2019-04-03 MED ORDER — NA SULFATE-K SULFATE-MG SULF 17.5-3.13-1.6 GM/177ML PO SOLN: 1.0000 | Freq: Once | ORAL | 0 refills | Status: AC

## 2019-04-03 NOTE — Patient Instructions (Addendum)
If you are age 53 or older, your body mass index should be between 23-30. Your Body mass index is 28.65 kg/m. If this is out of the aforementioned range listed, please consider follow up with your Primary Care Provider.  If you are age 64 or younger, your body mass index should be between 19-25. Your Body mass index is 28.65 kg/m. If this is out of the aformentioned range listed, please consider follow up with your Primary Care Provider.    You have been scheduled for an endoscopy and colonoscopy. Please follow the written instructions given to you at your visit today. Please pick up your prep supplies at the pharmacy within the next 1-3 days. If you use inhalers (even only as needed), please bring them with you on the day of your procedure.  We have sent the following medications to your pharmacy for you to pick up at your convenience:  Suprep Bowel prep Kit  Complete labs in 4 weeks.  Complete the home stool cards before starting the Ferrous Sulfate  Do not take Ferrous Sulfate for 1 week prior to your Colonoscopy.  Go directly to the emergency room if you develop: Chest pain, Shortness of Breath and profound fatigue  Due to recent changes in healthcare laws, you may see the results of your imaging and laboratory studies on MyChart before your provider has had a chance to review them.  We understand that in some cases there may be results that are confusing or concerning to you. Not all laboratory results come back in the same time frame and the provider may be waiting for multiple results in order to interpret others.  Please give [place] 48 hours in order for your provider to thoroughly review all the results before contacting the office for clarification of your results.   Thank you for choosing [facility] Noralyn Pick, CRNP

## 2019-04-03 NOTE — Progress Notes (Signed)
Reviewed and agree with management plan.  Tahirah Sara T. Dashiell Franchino, MD FACG [facility]  

## 2019-04-10 ENCOUNTER — Other Ambulatory Visit (INDEPENDENT_AMBULATORY_CARE_PROVIDER_SITE_OTHER): Payer: BC Managed Care – PPO

## 2019-04-10 DIAGNOSIS — D508 Other iron deficiency anemias: Secondary | ICD-10-CM

## 2019-04-10 DIAGNOSIS — K219 Gastro-esophageal reflux disease without esophagitis: Secondary | ICD-10-CM

## 2019-04-10 LAB — HEMOCCULT SLIDES (X 3 CARDS)
Fecal Occult Blood: NEGATIVE
OCCULT 1: NEGATIVE
OCCULT 2: NEGATIVE
OCCULT 3: NEGATIVE
OCCULT 4: NEGATIVE
OCCULT 5: NEGATIVE

## 2019-04-11 ENCOUNTER — Other Ambulatory Visit: Payer: Self-pay

## 2019-04-11 DIAGNOSIS — E782 Mixed hyperlipidemia: Secondary | ICD-10-CM

## 2019-04-11 DIAGNOSIS — E7841 Elevated Lipoprotein(a): Secondary | ICD-10-CM

## 2019-04-11 MED ORDER — REPATHA 140 MG/ML ~~LOC~~ SOSY: 140.0000 mg | PREFILLED_SYRINGE | SUBCUTANEOUS | 11 refills | Status: DC

## 2019-04-11 NOTE — Telephone Encounter (Signed)
Staff, please follow up on this.

## 2019-04-12 ENCOUNTER — Telehealth: Payer: Self-pay

## 2019-04-12 ENCOUNTER — Other Ambulatory Visit: Payer: Self-pay

## 2019-04-12 DIAGNOSIS — E7841 Elevated Lipoprotein(a): Secondary | ICD-10-CM

## 2019-04-12 DIAGNOSIS — E782 Mixed hyperlipidemia: Secondary | ICD-10-CM

## 2019-04-12 MED ORDER — REPATHA 140 MG/ML ~~LOC~~ SOSY: 140.0000 mg | PREFILLED_SYRINGE | SUBCUTANEOUS | 11 refills | Status: DC

## 2019-04-12 NOTE — Telephone Encounter (Signed)
Pt's insurance will not approve repatha but will approved Praluent, OK per MP to switch . Pt aware

## 2019-04-19 ENCOUNTER — Other Ambulatory Visit: Payer: Self-pay

## 2019-04-19 ENCOUNTER — Ambulatory Visit: Payer: BC Managed Care – PPO | Admitting: Cardiology

## 2019-04-19 DIAGNOSIS — E7800 Pure hypercholesterolemia, unspecified: Secondary | ICD-10-CM

## 2019-04-19 DIAGNOSIS — E782 Mixed hyperlipidemia: Secondary | ICD-10-CM

## 2019-04-19 MED ORDER — ALIROCUMAB 75 MG/ML ~~LOC~~ SOAJ: 75.0000 mg | Freq: Once | SUBCUTANEOUS | Status: DC

## 2019-04-20 ENCOUNTER — Telehealth: Payer: Self-pay | Admitting: General Surgery

## 2019-04-20 NOTE — Telephone Encounter (Signed)
Left a voicemail for patient to contact me back. Received a PA for suprep and I would like to know if the patient has picked it up or would like a discount card.

## 2019-04-24 ENCOUNTER — Other Ambulatory Visit: Payer: Self-pay | Admitting: Cardiology

## 2019-04-24 DIAGNOSIS — I10 Essential (primary) hypertension: Secondary | ICD-10-CM

## 2019-04-25 NOTE — Telephone Encounter (Signed)
Notified the patient that we received Suprep samples and that I left one at the front desk. The patient verbalized understanding and will be in to pick it up by Thursday 04/27/2019. ta

## 2019-04-25 NOTE — Telephone Encounter (Signed)
Spoke with Aaron Edelman at [facility] and the patients insurance doesn't cover any of the preps

## 2019-04-25 NOTE — Telephone Encounter (Signed)
Tried to contact the patient and no answer left a voicemail and sent a message on my chart.

## 2019-04-26 ENCOUNTER — Ambulatory Visit (INDEPENDENT_AMBULATORY_CARE_PROVIDER_SITE_OTHER): Payer: BC Managed Care – PPO

## 2019-04-26 ENCOUNTER — Other Ambulatory Visit: Payer: Self-pay | Admitting: Gastroenterology

## 2019-04-26 DIAGNOSIS — Z1159 Encounter for screening for other viral diseases: Secondary | ICD-10-CM

## 2019-04-26 LAB — SARS CORONAVIRUS 2 (TAT 6-24 HRS): SARS Coronavirus 2: NEGATIVE

## 2019-04-28 ENCOUNTER — Other Ambulatory Visit: Payer: Self-pay

## 2019-04-28 ENCOUNTER — Encounter: Payer: Self-pay | Admitting: Gastroenterology

## 2019-04-28 ENCOUNTER — Ambulatory Visit (AMBULATORY_SURGERY_CENTER): Payer: BC Managed Care – PPO | Admitting: Gastroenterology

## 2019-04-28 VITALS — BP 114/72 | HR 64 | Temp 96.6°F | Resp 14 | Ht 69.0 in | Wt 194.0 lb

## 2019-04-28 DIAGNOSIS — D508 Other iron deficiency anemias: Secondary | ICD-10-CM

## 2019-04-28 DIAGNOSIS — D509 Iron deficiency anemia, unspecified: Secondary | ICD-10-CM | POA: Diagnosis not present

## 2019-04-28 DIAGNOSIS — K2950 Unspecified chronic gastritis without bleeding: Secondary | ICD-10-CM | POA: Diagnosis not present

## 2019-04-28 DIAGNOSIS — D123 Benign neoplasm of transverse colon: Secondary | ICD-10-CM | POA: Diagnosis not present

## 2019-04-28 DIAGNOSIS — D124 Benign neoplasm of descending colon: Secondary | ICD-10-CM | POA: Diagnosis not present

## 2019-04-28 DIAGNOSIS — K317 Polyp of stomach and duodenum: Secondary | ICD-10-CM

## 2019-04-28 DIAGNOSIS — K219 Gastro-esophageal reflux disease without esophagitis: Secondary | ICD-10-CM | POA: Diagnosis not present

## 2019-04-28 DIAGNOSIS — K3189 Other diseases of stomach and duodenum: Secondary | ICD-10-CM | POA: Diagnosis not present

## 2019-04-28 DIAGNOSIS — K449 Diaphragmatic hernia without obstruction or gangrene: Secondary | ICD-10-CM | POA: Diagnosis not present

## 2019-04-28 MED ORDER — SODIUM CHLORIDE 0.9 % IV SOLN: 500.0000 mL | Freq: Once | INTRAVENOUS | Status: DC

## 2019-04-28 NOTE — Progress Notes (Signed)
Called to room to assist during endoscopic procedure.  Patient ID and intended procedure confirmed with present staff. Received instructions for my participation in the procedure from the performing physician.  

## 2019-04-28 NOTE — Patient Instructions (Signed)
Information on polyps, hiatal hernia, diverticulosis and hemorrhoids given to you today.  Await pathology results.  High fiber diet.  YOU HAD AN ENDOSCOPIC PROCEDURE TODAY AT [facility]:   Refer to the procedure report that was given to you for any specific questions about what was found during the examination.  If the procedure report does not answer your questions, please call your gastroenterologist to clarify.  If you requested that your care partner not be given the details of your procedure findings, then the procedure report has been included in a sealed envelope for you to review at your convenience later.  YOU SHOULD EXPECT: Some feelings of bloating in the abdomen. Passage of more gas than usual.  Walking can help get rid of the air that was put into your GI tract during the procedure and reduce the bloating. If you had a lower endoscopy (such as a colonoscopy or flexible sigmoidoscopy) you may notice spotting of blood in your stool or on the toilet paper. If you underwent a bowel prep for your procedure, you may not have a normal bowel movement for a few days.  Please Note:  You might notice some irritation and congestion in your nose or some drainage.  This is from the oxygen used during your procedure.  There is no need for concern and it should clear up in a day or so.  SYMPTOMS TO REPORT IMMEDIATELY:   Following lower endoscopy (colonoscopy or flexible sigmoidoscopy):  Excessive amounts of blood in the stool  Significant tenderness or worsening of abdominal pains  Swelling of the abdomen that is new, acute  Fever of 100F or higher   Following upper endoscopy (EGD)  Vomiting of blood or coffee ground material  New chest pain or pain under the shoulder blades  Painful or persistently difficult swallowing  New shortness of breath  Fever of 100F or higher  Black, tarry-looking stools  For urgent or emergent issues, a gastroenterologist can be reached at  any hour by calling [phone]. Do not use MyChart messaging for urgent concerns.    DIET:  We do recommend a small meal at first, but then you may proceed to your regular diet.  Drink plenty of fluids but you should avoid alcoholic beverages for 24 hours.  ACTIVITY:  You should plan to take it easy for the rest of today and you should NOT DRIVE or use heavy machinery until tomorrow (because of the sedation medicines used during the test).    FOLLOW UP: Our staff will call the number listed on your records 48-72 hours following your procedure to check on you and address any questions or concerns that you may have regarding the information given to you following your procedure. If we do not reach you, we will leave a message.  We will attempt to reach you two times.  During this call, we will ask if you have developed any symptoms of COVID 19. If you develop any symptoms (ie: fever, flu-like symptoms, shortness of breath, cough etc.) before then, please call [phone].  If you test positive for Covid 19 in the 2 weeks post procedure, please call and report this information to [place].    If any biopsies were taken you will be contacted by phone or by letter within the next 1-3 weeks.  Please call us at [phone] if you have not heard about the biopsies in 3 weeks.    SIGNATURES/CONFIDENTIALITY: You and/or your care partner have signed paperwork which  will be entered into your electronic medical record.  These signatures attest to the fact that that the information above on your After Visit Summary has been reviewed and is understood.  Full responsibility of the confidentiality of this discharge information lies with you and/or your care-partner.

## 2019-04-28 NOTE — Progress Notes (Signed)
Report to PACU, RN, vss, BBS= Clear.  

## 2019-04-28 NOTE — Op Note (Signed)
[place] Patient Name: Jeffrey Mcdaniel Procedure Date: 04/28/2019 2:35 PM MRN: [id number] Endoscopist: Ladene Artist , MD Age: 54 Referring MD:  Date of Birth: [birth year] Gender: Male Account #: [id number] Procedure:                Upper GI endoscopy Indications:              Unexplained iron deficiency anemia,                            Gastroesophageal reflux disease Medicines:                Monitored Anesthesia Care Procedure:                Pre-Anesthesia Assessment:                           - Prior to the procedure, a History and Physical                            was performed, and patient medications and                            allergies were reviewed. The patient's tolerance of                            previous anesthesia was also reviewed. The risks                            and benefits of the procedure and the sedation                            options and risks were discussed with the patient.                            All questions were answered, and informed consent                            was obtained. Prior Anticoagulants: The patient has                            taken no previous anticoagulant or antiplatelet                            agents. ASA Grade Assessment: II - A patient with                            mild systemic disease. After reviewing the risks                            and benefits, the patient was deemed in                            satisfactory condition to undergo the procedure.  After obtaining informed consent, the endoscope was                            passed under direct vision. Throughout the                            procedure, the patient's blood pressure, pulse, and                            oxygen saturations were monitored continuously. The                            Endoscope was introduced through the mouth, and                            advanced to the second part of duodenum.  The upper                            GI endoscopy was accomplished without difficulty.                            The patient tolerated the procedure well. Scope In: Scope Out: Findings:                 The examined esophagus was normal.                           Patchy mildly erythematous mucosa without bleeding                            was found in the gastric body. Biopsies were taken                            with a cold forceps for histology. Biopsies were                            taken with a cold forceps for histology.                           A few 3 to 5 mm sessile polyps with no bleeding and                            no stigmata of recent bleeding were found in the                            gastric body. Biopsies were taken with a cold                            forceps for histology.                           A small hiatal hernia was present.  The exam of the stomach was otherwise normal.                           The duodenal bulb was normal.                           Localized mucosal flattening was found in the                            second portion of the duodenum. Biopsies for                            histology were taken with a cold forceps for                            evaluation of celiac disease. Complications:            No immediate complications. Estimated Blood Loss:     Estimated blood loss was minimal. Impression:               - Normal esophagus.                           - Erythematous mucosa in the gastric body. Biopsied.                           - A few gastric polyps. Biopsied.                           - Small hiatal hernia.                           - Localized flattening of the folds in the second                            portion of the duodenum. Biopsied. Recommendation:           - Patient has a contact number available for                            emergencies. The signs and symptoms of potential                             delayed complications were discussed with the                            patient. Return to normal activities tomorrow.                            Written discharge instructions were provided to the                            patient.                           - Resume previous diet.                           -  Antireflux measures.                           - Continue present medications.                           - Await pathology results. Ladene Artist, MD 04/28/2019 3:21:08 PM This report has been signed electronically.

## 2019-04-28 NOTE — Progress Notes (Signed)
Vitals-CA Temp-LC  Pt's states no medical or surgical changes since previsit or office visit.

## 2019-04-28 NOTE — Op Note (Signed)
[place] Patient Name: Jeffrey Mcdaniel Procedure Date: 04/28/2019 2:35 PM MRN: [id number] Endoscopist: Ladene Artist , MD Age: 54 Referring MD:  Date of Birth: 1966-01-22 Gender: Male Account #: [id number] Procedure:                Colonoscopy Indications:              Unexplained iron deficiency anemia Medicines:                Monitored Anesthesia Care Procedure:                Pre-Anesthesia Assessment:                           - Prior to the procedure, a History and Physical                            was performed, and patient medications and                            allergies were reviewed. The patient's tolerance of                            previous anesthesia was also reviewed. The risks                            and benefits of the procedure and the sedation                            options and risks were discussed with the patient.                            All questions were answered, and informed consent                            was obtained. Prior Anticoagulants: The patient has                            taken no previous anticoagulant or antiplatelet                            agents. ASA Grade Assessment: II - A patient with                            mild systemic disease. After reviewing the risks                            and benefits, the patient was deemed in                            satisfactory condition to undergo the procedure.                           After obtaining informed consent, the colonoscope  was passed under direct vision. Throughout the                            procedure, the patient's blood pressure, pulse, and                            oxygen saturations were monitored continuously. The                            Colonoscope was introduced through the anus and                            advanced to the the cecum, identified by                            appendiceal orifice and ileocecal  valve. The                            ileocecal valve, appendiceal orifice, and rectum                            were photographed. The quality of the bowel                            preparation was excellent. The colonoscopy was                            performed without difficulty. The patient tolerated                            the procedure well. Scope In: 2:44:19 PM Scope Out: 2:57:14 PM Scope Withdrawal Time: 0 hours 10 minutes 39 seconds  Total Procedure Duration: 0 hours 12 minutes 55 seconds  Findings:                 The perianal and digital rectal examinations were                            normal.                           Two sessile polyps were found in the descending                            colon and transverse colon. The polyps were 6 to 8                            mm in size. These polyps were removed with a cold                            snare. Resection and retrieval were complete.                           A few small-mouthed diverticula were found in the  left colon. There was no evidence of diverticular                            bleeding.                           Internal hemorrhoids were found during                            retroflexion. The hemorrhoids were small and Grade                            I (internal hemorrhoids that do not prolapse).                           The exam was otherwise without abnormality on                            direct and retroflexion views. Complications:            No immediate complications. Estimated blood loss:                            None. Estimated Blood Loss:     Estimated blood loss: none. Impression:               - Two 6 to 8 mm polyps in the descending colon and                            in the transverse colon, removed with a cold snare.                            Resected and retrieved.                           - Mild diverticulosis in the left colon.                            - Internal hemorrhoids.                           - The examination was otherwise normal on direct                            and retroflexion views. Recommendation:           - Repeat colonoscopy after studies are complete for                            surveillance based on pathology results.                           - Patient has a contact number available for                            emergencies. The signs and symptoms of potential  delayed complications were discussed with the                            patient. Return to normal activities tomorrow.                            Written discharge instructions were provided to the                            patient.                           - High fiber diet.                           - Continue present medications.                           - Await pathology results. Ladene Artist, MD 04/28/2019 3:15:03 PM This report has been signed electronically.

## 2019-05-02 ENCOUNTER — Other Ambulatory Visit: Payer: Self-pay

## 2019-05-02 ENCOUNTER — Telehealth: Payer: Self-pay | Admitting: *Deleted

## 2019-05-02 DIAGNOSIS — E7800 Pure hypercholesterolemia, unspecified: Secondary | ICD-10-CM

## 2019-05-02 MED ORDER — ATORVASTATIN CALCIUM 40 MG PO TABS: 40.0000 mg | ORAL_TABLET | Freq: Every day | ORAL | 3 refills | Status: DC

## 2019-05-02 NOTE — Telephone Encounter (Signed)
  Follow up Call-  Call back number 04/28/2019  Post procedure Call Back phone  # [phone]  Permission to leave phone message Yes     Patient questions:  Do you have a fever, pain , or abdominal swelling? No. Pain Score  0 *  Have you tolerated food without any problems? Yes.    Have you been able to return to your normal activities? Yes.    Do you have any questions about your discharge instructions: Diet   No. Medications  No. Follow up visit  No.  Do you have questions or concerns about your Care? No.  Actions: * If pain score is 4 or above: No action needed, pain <4.  1. Have you developed a fever since your procedure? no  2.   Have you had an respiratory symptoms (SOB or cough) since your procedure? no  3.   Have you tested positive for COVID 19 since your procedure? no  4.   Have you had any family members/close contacts diagnosed with the COVID 19 since your procedure?  no   If yes to any of these questions please route to Joylene John, RN and Alphonsa Gin, Therapist, sports.

## 2019-05-10 ENCOUNTER — Encounter: Payer: Self-pay | Admitting: Gastroenterology

## 2019-05-13 ENCOUNTER — Ambulatory Visit: Payer: BC Managed Care – PPO | Attending: Internal Medicine

## 2019-05-13 DIAGNOSIS — Z23 Encounter for immunization: Secondary | ICD-10-CM

## 2019-05-13 NOTE — Progress Notes (Signed)
   Covid-19 Vaccination Clinic  Name:  KENDRICK LEDYARD    MRN: [id number] DOB: [birth year]  05/13/2019  Mr. Hornstein was observed post Covid-19 immunization for 15 minutes without incident. He was provided with Vaccine Information Sheet and instruction to access the V-Safe system.   Mr. Smittle was instructed to call 911 with any severe reactions post vaccine: Marland Kitchen Difficulty breathing  . Swelling of face and throat  . A fast heartbeat  . A bad rash all over body  . Dizziness and weakness   Immunizations Administered    Name Date Dose VIS Date Route   Pfizer COVID-19 Vaccine 05/13/2019  9:58 AM 0.3 mL 01/20/2019 Intramuscular   Manufacturer: [place]   Lot: [id number]   [place]: [id number]

## 2019-06-30 NOTE — Telephone Encounter (Signed)
From patient.

## 2019-09-22 ENCOUNTER — Ambulatory Visit: Payer: BC Managed Care – PPO | Admitting: Cardiology

## 2019-09-27 ENCOUNTER — Other Ambulatory Visit (HOSPITAL_COMMUNITY): Payer: Self-pay | Admitting: Cardiology

## 2019-09-27 DIAGNOSIS — E7841 Elevated Lipoprotein(a): Secondary | ICD-10-CM | POA: Diagnosis not present

## 2019-09-27 DIAGNOSIS — E782 Mixed hyperlipidemia: Secondary | ICD-10-CM | POA: Diagnosis not present

## 2019-09-29 LAB — LIPID PANEL
Chol/HDL Ratio: 2.2 ratio (ref 0.0–5.0)
Cholesterol, Total: 187 mg/dL (ref 100–199)
HDL: 86 mg/dL (ref >39)
LDL Chol Calc (NIH): 77 mg/dL (ref 0–99)
Triglycerides: 146 mg/dL (ref 0–149)
VLDL Cholesterol Cal: 24 mg/dL (ref 5–40)

## 2019-09-29 LAB — LIPOPROTEIN A (LPA): Lipoprotein (a): 508.5 nmol/L — ABNORMAL HIGH (ref <75.0)

## 2019-10-05 ENCOUNTER — Ambulatory Visit: Payer: BC Managed Care – PPO | Admitting: Cardiology

## 2019-10-05 ENCOUNTER — Encounter: Payer: Self-pay | Admitting: Cardiology

## 2019-10-05 ENCOUNTER — Other Ambulatory Visit: Payer: Self-pay | Admitting: Cardiology

## 2019-10-05 ENCOUNTER — Other Ambulatory Visit: Payer: Self-pay

## 2019-10-05 VITALS — BP 128/90 | HR 70 | Resp 17 | Ht 69.0 in | Wt 191.0 lb

## 2019-10-05 DIAGNOSIS — I2510 Atherosclerotic heart disease of native coronary artery without angina pectoris: Secondary | ICD-10-CM | POA: Diagnosis not present

## 2019-10-05 DIAGNOSIS — I10 Essential (primary) hypertension: Secondary | ICD-10-CM

## 2019-10-05 DIAGNOSIS — E7841 Elevated Lipoprotein(a): Secondary | ICD-10-CM | POA: Diagnosis not present

## 2019-10-05 DIAGNOSIS — E782 Mixed hyperlipidemia: Secondary | ICD-10-CM | POA: Diagnosis not present

## 2019-10-05 MED ORDER — REPATHA 140 MG/ML ~~LOC~~ SOSY: 140.0000 mg | PREFILLED_SYRINGE | SUBCUTANEOUS | 11 refills | Status: DC

## 2019-10-05 NOTE — Progress Notes (Signed)
Follow up visit  Subjective:   Jeffrey Mcdaniel, male    DOB: 03/11/1965, 54 y.o.   MRN: [id number]   Chief Complaint  Patient presents with   Coronary Artery Disease   Hyperlipidemia   Follow-up    44 month    54 y/o Caucasian male with coronary artery disease, hypertension, controlled hyperlipidemia, family h/o premature CAD.  Since increasing amlodipine to 10 mg daily, his blood pressure is better controlled.  He is going to the gym twice a week.  On other days, he is walking up to 3 miles.  He has also recently started weight training. He denies chest pain, shortness of breath, palpitations, leg edema, orthopnea, PND, TIA/syncope.  At last visit started patient on Repatha, but switched to Praluent due to insurance.  Reviewed recent lipid panel and lipoprotein results with the patient, details below.  Patient is able to exercise regularly, including 3 miles walks at 20-minute/mile pace, weight training without any chest pain, shortness of breath symptoms.  Blood pressure is usually well controlled, elevated today.   Current Outpatient Medications on File Prior to Visit  Medication Sig Dispense Refill   Alirocumab (PRALUENT) 75 MG/ML SOAJ Inject 1 pen into the skin every 14 (fourteen) days.     amLODipine (NORVASC) 10 MG tablet Take 1 tablet (10 mg total) by mouth daily. 90 tablet 3   aspirin EC 81 MG tablet Take 81 mg by mouth daily.     atorvastatin (LIPITOR) 40 MG tablet Take 1 tablet (40 mg total) by mouth daily. 90 tablet 3   ezetimibe (ZETIA) 10 MG tablet TAKE 1 TABLET BY MOUTH EVERY DAY 90 tablet 3   metoprolol succinate (TOPROL-XL) 50 MG 24 hr tablet TAKE 1 (ONE) TABLET ONCE DAILY IN THE EVENING 90 tablet 2   nitroGLYCERIN (NITROSTAT) 0.4 MG SL tablet Place 0.4 mg under the tongue every 5 (five) minutes as needed for chest pain.     Omeprazole 20 MG TBDD Take 20 mg by mouth daily.      tadalafil (CIALIS) 10 MG tablet as needed.     Current Facility-Administered  Medications on File Prior to Visit  Medication Dose Route Frequency Provider Last Rate Last Admin   Alirocumab SOAJ 75 mg  75 mg Subcutaneous Once Yazlyn Wentzel, Reynold Bowen, MD        Cardiovascular studies:  EKG 10/05/2019: Sinus rhythm 69 bpm Cannot exclude old posterior infarct   EKG 02/21/2019: Sinus rhythm 69 bpm. Incomplete RBBB. No change compared to previous.  Echocardiogram 02/18/2018: Left ventricle cavity is normal in size. Normal global wall motion. Normal diastolic filling pattern. Calculated EF 65%. Left atrial cavity is mildly dilated at 4.4 cm. Structurally normal mitral valve with trace regurgitation. Mild tricuspid regurgitation. No evidence of pulmonary hypertension.  CT coronary 01/25/2018: 1. Calcium score 3277.  99th percentile for age and sex matched control. 2. Right dominant circulation. 3.  Moderate diffuse CAD.  At least moderate stenosis and mid RCA, proximal LAD, proximal ramus intermedius, and severe stenosis in occiput PDA.  Unable to process CT FFR as study is affected by motion.  Cardiac catheterization recommended.  Exercise Treadmill Stress Test 01/14/2018: Indication: FHx, palpitation, chest pressure The patient exercised on Bruce protocol for  10:00 min. Patient achieved  11.81 METS and reached HR  169 bpm, which is  99 % of maximum age-predicted HR.  Stress test terminated due to fatigue.   Exercise capacity was normal. HR Response to Exercise: Appropriate. BP Response to Exercise:  Resting hypertension- appropriate response. Chest Pain: none. Arrhythmias: none. Resting EKG demonstrates sinus rhyth, incomplete RBBB. ST Changes: With peak exercise there were 1 mm horizontal ST depressions in lateral leads V2-V6, 1.5 downsloping ST depressions in lead V2, recovered within 2 min during recovery.  EKG changes are equivocal for ischemia.  Overall Impression: Equicoval stress test. Recommendations:  Consider nuclear sress test or coronary CT  angiogram  Recent labs: 09/27/2019:  Cholesterol 187, triglycerides 146, HDL 86, LDL 77. Lipoprotein a 508.5  03/02/19: Cholesterol 199, triglycerides 106, HDL 98, LDL 83. Lipoprotein a 589  12/22/2017: H/H 13.7/44.2.  MCV 83.  Platelets 212.   Glucose 98.  BUN/creatinine 17/0.9.  eGFR 89/108.  Sodium 139, potassium 4.0.  Rest of the CMP normal Cholesterol 198, triglycerides 160, HDL 73, LDL 93. TSH 1.2 normal   Review of Systems  Cardiovascular: Negative for chest pain, dyspnea on exertion, leg swelling, palpitations and syncope.        Vitals:   10/05/19 1141 10/05/19 1147  BP: (!) 134/104 128/90  Pulse: 84 70  Resp: 17   SpO2: 99%      Objective:   Physical Exam Vitals and nursing note reviewed.  Constitutional:      Appearance: He is well-developed.  Neck:     Vascular: No JVD.  Cardiovascular:     Rate and Rhythm: Normal rate and regular rhythm.     Pulses: Intact distal pulses.     Heart sounds: Normal heart sounds. No murmur heard.   Pulmonary:     Effort: Pulmonary effort is normal.     Breath sounds: Normal breath sounds. No wheezing or rales.         Assessment & Recommendations:   54 y/o Caucasian male with coronary artery disease without angina, hypertension, controlled hyperlipidemia, family h/o premature CAD.  CAD: Moderate nonobstructive. No symptoms of angina. Continue medical management with aspirin, statin + zetia, metoprolol and amlodipine. See lipid management below.  Hypertension: Fairly well controlled on amlodipine 10, metoprolol succinate 50.    Hyperlipidemia: LDL 83-->77 on Praluent.  Lipoprotein a 589--.500. He has not responded to Praluent as expected.  I recommend switching to Repatha.  Repeat lipid panel and lipoprotein in 6 months.  Down the road, I hope there will be more options for targeted therapy against lipoprotein a.  Until then, continue guideline directed management for LDL reduction.  In absence of anginal  symptoms, will continue medical management and heart healthy diet and lifestyle.   Follow-up in 6 months.    [name], MD [facility] Pager: [phone] Office: [phone] If no answer Cell [phone]

## 2019-10-09 IMAGING — CT CT HEART MORP W/ CTA COR W/ SCORE W/ CA W/CM &/OR W/O CM
1 of 8 series · 9 of 20 positions shown, 12 images · IV contrast (APPLIED)
Comparison: None.

Addendum:
EXAM:
OVER-READ INTERPRETATION  CT CHEST

The following report is an over-read performed by radiologist Dr.
Costive Eliyasu [[email or web address]] on 01/25/2018. This
over-read does not include interpretation of cardiac or coronary
anatomy or pathology. The coronary CTA interpretation by the
cardiologist is attached.
CLINICAL DATA: 52-year-old male with atypical chest pain.
Cardiac/Coronary  CT
TECHNIQUE: The patient was scanned on a Phillips Force scanner.

[Series 10: multiphase · axial · 0.39mm/px · z∈[+1023,+1116]mm · 9 of 1746 slices shown, 12 images]
[im 175/1746  vessel]
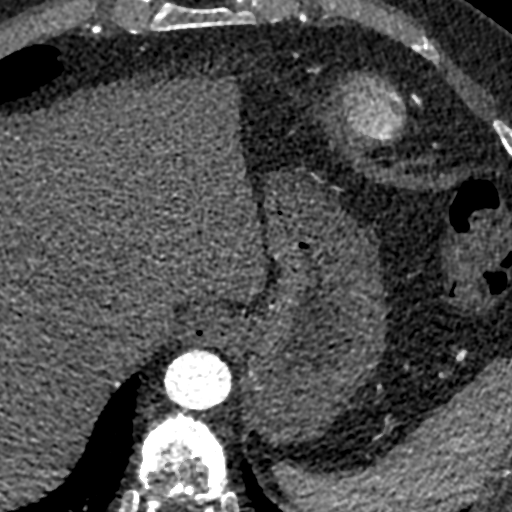
[im 175/1746  lung]
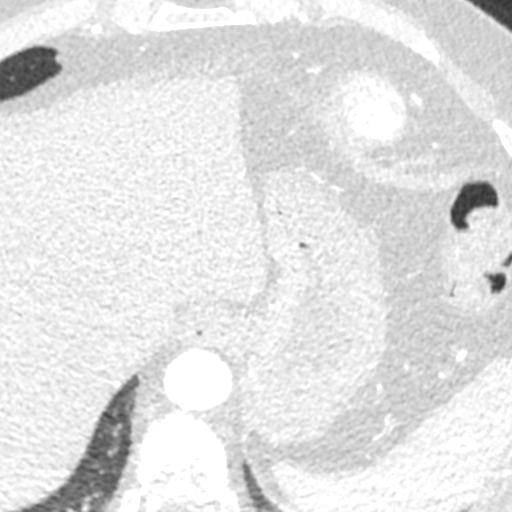
[im 350/1746  vessel]
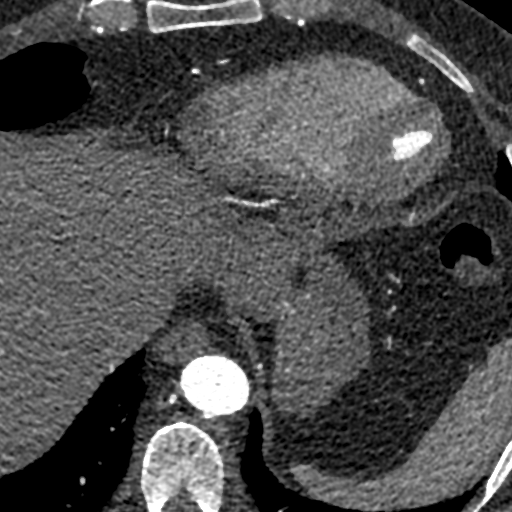
[im 524/1746  vessel]
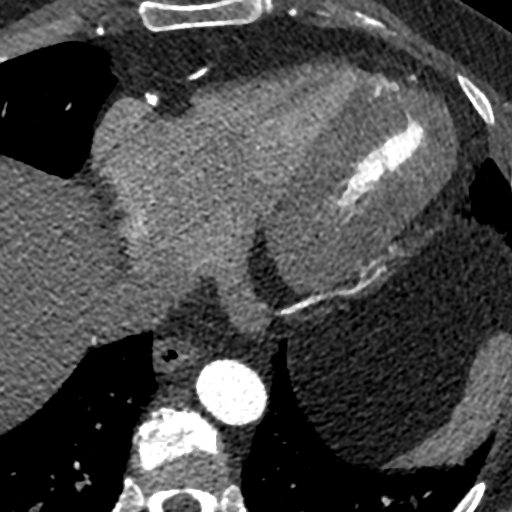
[im 699/1746  vessel]
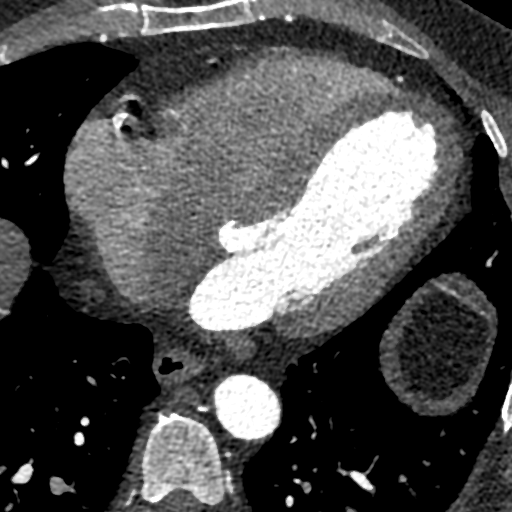
[im 873/1746  vessel]
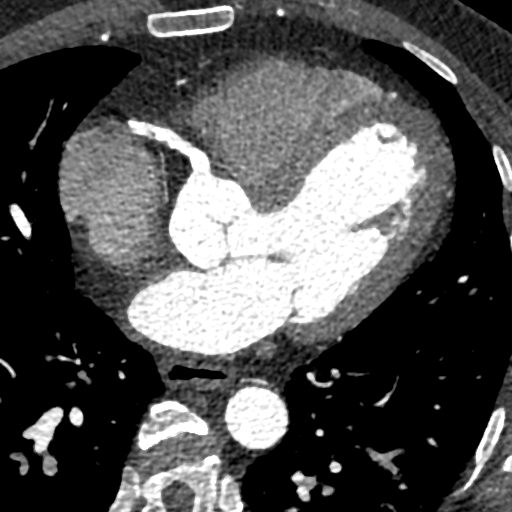
[im 873/1746  lung]
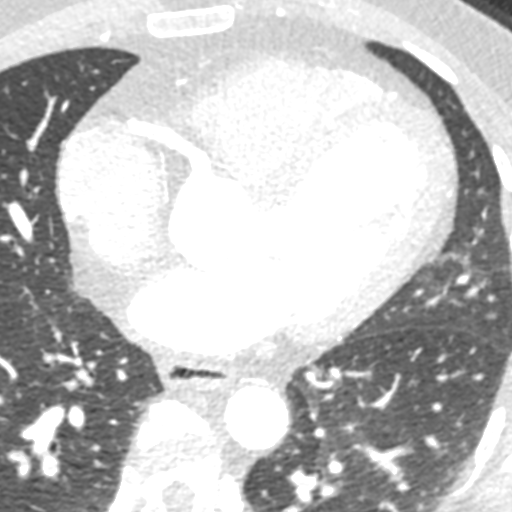
[im 1048/1746  vessel]
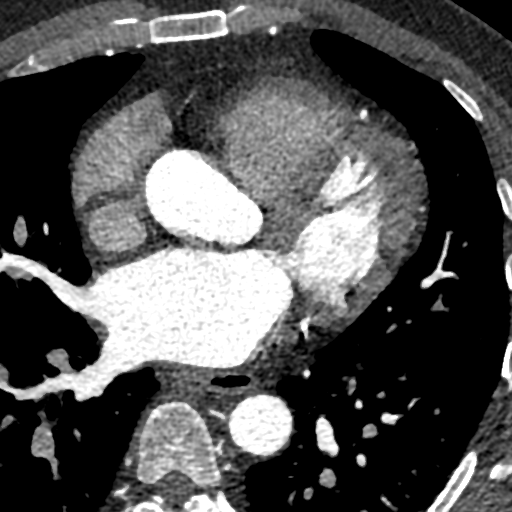
[im 1222/1746  vessel]
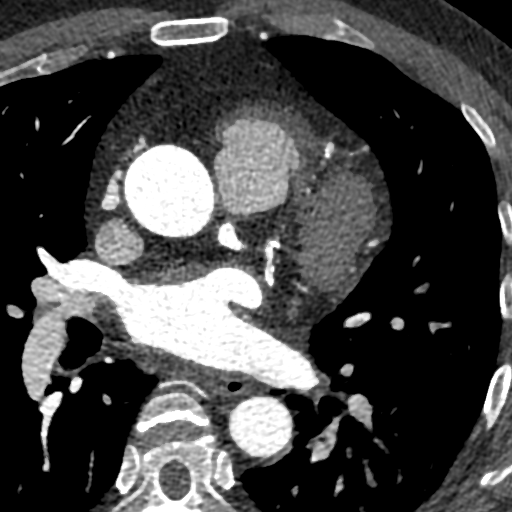
[im 1397/1746  vessel]
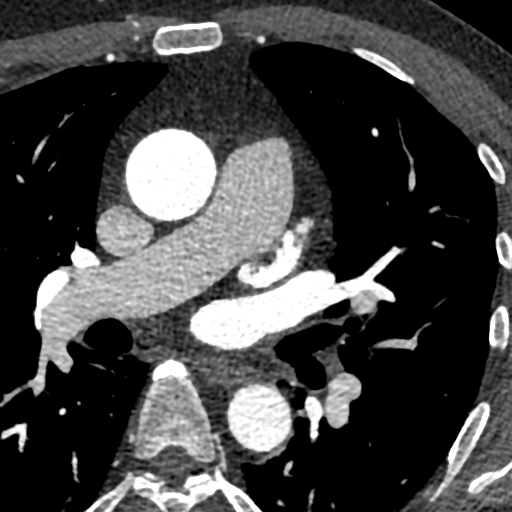
[im 1571/1746  vessel]
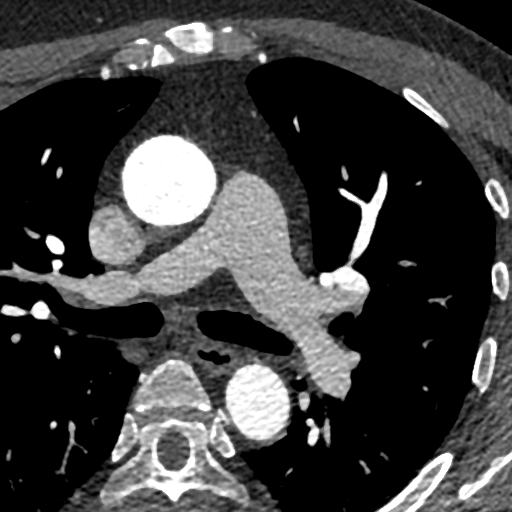
[im 1571/1746  lung]
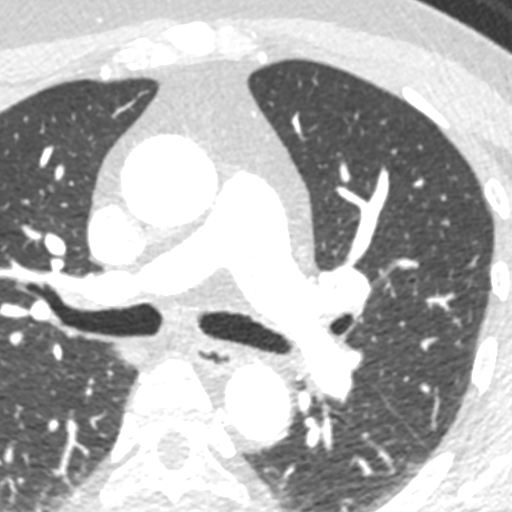

[9 of 20 positions shown; findings below may reference images not displayed]

FINDINGS: Vascular: Heart is mildly enlarged. Visualized aorta normal caliber.

Mediastinum/Nodes: No adenopathy in the lower mediastinum or hila.

Lungs/Pleura: No confluent opacities or effusions.

Upper Abdomen: Imaging into the upper abdomen shows no acute
findings.

Musculoskeletal: Chest wall soft tissues are unremarkable. No acute
bony abnormality.
IMPRESSION: Mild cardiomegaly.  No acute findings.
FINDINGS: A 120 kV prospective scan was triggered in the descending thoracic
aorta at 111 HU's. Axial non-contrast 3 mm slices were carried out
through the heart. The data set was analyzed on a dedicated work
station and scored using the Agatson method. Gantry rotation speed
was 250 msecs and collimation was .6 mm. No beta blockade and 0.8 mg
of sl NTG was given. The 3D data set was reconstructed in 5%
intervals of the 67-82 % of the R-R cycle. Diastolic phases were
analyzed on a dedicated work station using MPR, MIP and VRT modes.
The patient received 80 cc of contrast.

Aorta: Normal size. Mild calcifications and atheroma. No dissection.

Aortic Valve:  Trileaflet.  No calcifications.

Coronary Arteries:  Normal coronary origin.  Right dominance.

RCA is a very large dominant artery that gives rise to PDA and a
large PLA. There is heavy diffuse predominantly calcified plaque, in
the proximal RCA with maximum stenosis 25-50%, in mid RCA with
50-69% stenosis, distal RCA with 25-50% stenosis.

PLA is a very large artery that partially supplies inferolateral
wall (OM territory). There is diffuse 25-50% stenosis.

PDA is a small artery that has severe proximal stenosis > 70%.

Left main is a large artery that gives rise to LAD, ramus
intermedius and LCX arteries. Left main has no plaque.

LAD is a large vessel that gives rise to one diagonal artery and
wraps around the apex. Proximal LAD has moderate circumferential
calcified plaque with stenosis 50-69%. Mid LAD is affected by motion
but there appears to be only mild plaque in the mid and distal LAD.

D1 is small.

Ramus intermedius is a small artery with moderate plaque in the
proximal portion with stenosis 50-69%.

LCX is a very small non-dominant artery with moderate plaque in the
mid portion with stenosis 50-69%.

Other findings:

Normal pulmonary vein drainage into the left atrium.

Normal let atrial appendage without a thrombus.

Normal size of the pulmonary artery.
IMPRESSION: 1. Coronary calcium score of 4622. This was 99 percentile for age
and sex matched control.

2. Normal coronary origin with right dominance.

3. Moderate diffuse CAD. There is at least moderate stenosis in the
mid RCA, proximal LAD, proximal portion of ramus intermedius and
severe stenosis in the proximal PDA. We are unable to process CT FFR
as the study is affected by motion. A cardiac catheterization is
recommended.

*** End of Addendum ***

## 2019-10-11 NOTE — Telephone Encounter (Signed)
No. We need to appeal. He has been on Praluent with no appreciable improvement.

## 2019-11-01 ENCOUNTER — Other Ambulatory Visit: Payer: Self-pay | Admitting: Cardiology

## 2019-11-01 DIAGNOSIS — I2510 Atherosclerotic heart disease of native coronary artery without angina pectoris: Secondary | ICD-10-CM

## 2019-11-10 DIAGNOSIS — R7301 Impaired fasting glucose: Secondary | ICD-10-CM | POA: Diagnosis not present

## 2019-12-01 ENCOUNTER — Other Ambulatory Visit: Payer: Self-pay

## 2019-12-01 ENCOUNTER — Telehealth: Payer: Self-pay

## 2019-12-01 MED ORDER — REPATHA SURECLICK 140 MG/ML ~~LOC~~ SOAJ: 1.0000 "application " | SUBCUTANEOUS | 3 refills | Status: DC

## 2019-12-01 NOTE — Telephone Encounter (Signed)
done

## 2019-12-06 ENCOUNTER — Other Ambulatory Visit: Payer: Self-pay | Admitting: Cardiology

## 2019-12-06 DIAGNOSIS — I10 Essential (primary) hypertension: Secondary | ICD-10-CM

## 2020-03-20 ENCOUNTER — Other Ambulatory Visit: Payer: Self-pay

## 2020-03-20 MED ORDER — PRALUENT 75 MG/ML ~~LOC~~ SOAJ: 1.0000 mL | SUBCUTANEOUS | 3 refills | Status: DC

## 2020-03-23 ENCOUNTER — Other Ambulatory Visit: Payer: Self-pay | Admitting: Cardiology

## 2020-03-23 DIAGNOSIS — I10 Essential (primary) hypertension: Secondary | ICD-10-CM

## 2020-03-25 ENCOUNTER — Other Ambulatory Visit: Payer: Self-pay | Admitting: Cardiology

## 2020-04-03 ENCOUNTER — Other Ambulatory Visit (HOSPITAL_COMMUNITY): Payer: Self-pay | Admitting: Cardiology

## 2020-04-04 LAB — LIPID PANEL
Chol/HDL Ratio: 1.9 ratio (ref 0.0–5.0)
Cholesterol, Total: 120 mg/dL (ref 100–199)
HDL: 63 mg/dL (ref >39)
LDL Chol Calc (NIH): 40 mg/dL (ref 0–99)
Triglycerides: 89 mg/dL (ref 0–149)
VLDL Cholesterol Cal: 17 mg/dL (ref 5–40)

## 2020-04-04 LAB — LIPOPROTEIN A (LPA): Lipoprotein (a): 422.1 nmol/L — ABNORMAL HIGH (ref <75.0)

## 2020-04-05 ENCOUNTER — Ambulatory Visit: Payer: BC Managed Care – PPO | Admitting: Cardiology

## 2020-04-10 ENCOUNTER — Other Ambulatory Visit: Payer: Self-pay | Admitting: Cardiology

## 2020-04-10 DIAGNOSIS — E7800 Pure hypercholesterolemia, unspecified: Secondary | ICD-10-CM

## 2020-04-12 ENCOUNTER — Other Ambulatory Visit: Payer: Self-pay

## 2020-04-12 ENCOUNTER — Ambulatory Visit: Payer: BC Managed Care – PPO | Admitting: Cardiology

## 2020-04-12 ENCOUNTER — Encounter: Payer: Self-pay | Admitting: Cardiology

## 2020-04-12 VITALS — BP 154/99 | HR 69 | Temp 98.4°F | Resp 18 | Ht 69.0 in | Wt 192.2 lb

## 2020-04-12 DIAGNOSIS — E7841 Elevated Lipoprotein(a): Secondary | ICD-10-CM

## 2020-04-12 DIAGNOSIS — E782 Mixed hyperlipidemia: Secondary | ICD-10-CM

## 2020-04-12 DIAGNOSIS — I2510 Atherosclerotic heart disease of native coronary artery without angina pectoris: Secondary | ICD-10-CM

## 2020-04-12 DIAGNOSIS — I10 Essential (primary) hypertension: Secondary | ICD-10-CM

## 2020-04-12 MED ORDER — NITROGLYCERIN 0.4 MG SL SUBL: 0.4000 mg | SUBLINGUAL_TABLET | SUBLINGUAL | 2 refills | Status: AC | PRN

## 2020-04-12 NOTE — Progress Notes (Signed)
Follow up visit  Subjective:   Jeffrey Mcdaniel, male    DOB: 1965-03-10, 55 y.o.   MRN: [id number]   Chief Complaint  Patient presents with  . Coronary Artery Disease  . Follow-up    9 month    55 y/o Caucasian male with coronary artery disease, hypertension, controlled hyperlipidemia, family h/o premature CAD.  His lipid improved on Repatha, but he had switch back to Praluent due to insurance coverage issue. He is doing well, denies chest pain, shortness of breath, palpitations, leg edema, orthopnea, PND, TIA/syncope. He walks 4.5 miles daily without difficulty.    Current Outpatient Medications on File Prior to Visit  Medication Sig Dispense Refill  . amLODipine (NORVASC) 10 MG tablet TAKE 1 TABLET BY MOUTH EVERY DAY 90 tablet 3  . aspirin EC 81 MG tablet Take 81 mg by mouth daily.    Marland Kitchen atorvastatin (LIPITOR) 40 MG tablet TAKE 1 TABLET BY MOUTH EVERY DAY 90 tablet 3  . ezetimibe (ZETIA) 10 MG tablet TAKE 1 TABLET BY MOUTH EVERY DAY 90 tablet 3  . metoprolol succinate (TOPROL-XL) 50 MG 24 hr tablet TAKE 1 (ONE) TABLET ONCE DAILY IN THE EVENING 90 tablet 2  . nitroGLYCERIN (NITROSTAT) 0.4 MG SL tablet Place 0.4 mg under the tongue every 5 (five) minutes as needed for chest pain.    Marland Kitchen Omeprazole 20 MG TBDD Take 20 mg by mouth daily.     Marland Kitchen PRALUENT 75 MG/ML SOAJ Inject 1 mL into the skin every 14 (fourteen) days. 2 mL 3  . tadalafil (CIALIS) 10 MG tablet as needed.     No current facility-administered medications on file prior to visit.    Cardiovascular studies:  EKG 04/12/2020: Sinus rhythm 86 bpm Incomplete right bundle branch block.  Left atrial enlargement.  Nonspecific ST depression  -Nondiagnostic.   Echocardiogram 02/18/2018: Left ventricle cavity is normal in size. Normal global wall motion. Normal diastolic filling pattern. Calculated EF 65%. Left atrial cavity is mildly dilated at 4.4 cm. Structurally normal mitral valve with trace regurgitation. Mild tricuspid  regurgitation. No evidence of pulmonary hypertension.  CT coronary 01/25/2018: 1. Calcium score 3277.  99th percentile for age and sex matched control. 2. Right dominant circulation. 3.  Moderate diffuse CAD.  At least moderate stenosis and mid RCA, proximal LAD, proximal ramus intermedius, and severe stenosis in occiput PDA.  Unable to process CT FFR as study is affected by motion.  Cardiac catheterization recommended.  Exercise Treadmill Stress Test 01/14/2018: Indication: FHx, palpitation, chest pressure The patient exercised on Bruce protocol for  10:00 min. Patient achieved  11.81 METS and reached HR  169 bpm, which is  99 % of maximum age-predicted HR.  Stress test terminated due to fatigue.   Exercise capacity was normal. HR Response to Exercise: Appropriate. BP Response to Exercise: Resting hypertension- appropriate response. Chest Pain: none. Arrhythmias: none. Resting EKG demonstrates sinus rhyth, incomplete RBBB. ST Changes: With peak exercise there were 1 mm horizontal ST depressions in lateral leads V2-V6, 1.5 downsloping ST depressions in lead V2, recovered within 2 min during recovery.  EKG changes are equivocal for ischemia.  Overall Impression: Equicoval stress test. Recommendations:  Consider nuclear sress test or coronary CT angiogram  Recent labs: 04/13/2019: Cholesterol 120, triglycerides 89, HDL 63, LDL 40.  09/27/2019:  Cholesterol 187, triglycerides 146, HDL 86, LDL 77. Lipoprotein a 508.5  03/02/19: Cholesterol 199, triglycerides 106, HDL 98, LDL 83. Lipoprotein a 589  12/22/2017: H/H 13.7/44.2.  MCV 83.  Platelets 212.   Glucose 98.  BUN/creatinine 17/0.9.  eGFR 89/108.  Sodium 139, potassium 4.0.  Rest of the CMP normal Cholesterol 198, triglycerides 160, HDL 73, LDL 93. TSH 1.2 normal   Review of Systems  Cardiovascular: Negative for chest pain, dyspnea on exertion, leg swelling, palpitations and syncope.        Vitals:   04/12/20 1357 04/12/20  1400  BP: (!) 152/96 (!) 154/99  Pulse: 88 69  Resp: 18   Temp: 98.4 F (36.9 C)   SpO2: 99%      Objective:   Physical Exam Vitals and nursing note reviewed.  Constitutional:      Appearance: He is well-developed.  Neck:     Vascular: No JVD.  Cardiovascular:     Rate and Rhythm: Normal rate and regular rhythm.     Pulses: Intact distal pulses.     Heart sounds: Normal heart sounds. No murmur heard.   Pulmonary:     Effort: Pulmonary effort is normal.     Breath sounds: Normal breath sounds. No wheezing or rales.         Assessment & Recommendations:   55 y/o Caucasian male with coronary artery disease without angina, hypertension, controlled hyperlipidemia, family h/o premature CAD.  CAD: Moderate nonobstructive. No symptoms of angina. Continue medical management with aspirin, statin + zetia, metoprolol and amlodipine. See lipid management below.  Hypertension: Fairly well controlled on amlodipine 10, metoprolol succinate 50.   Elevated today, likely white coat hypertension. No changes made today.   Hyperlipidemia: LDL down to 40, lipoprotein down to 422 on Repatha, but back on Prealuent due to insurance reasons.   F/u 6 months after repeat lipid panel   [name], MD [facility] Pager: [phone] Office: [phone] If no answer Cell [phone]

## 2020-06-21 ENCOUNTER — Other Ambulatory Visit: Payer: Self-pay | Admitting: Cardiology

## 2020-06-21 DIAGNOSIS — I25118 Atherosclerotic heart disease of native coronary artery with other forms of angina pectoris: Secondary | ICD-10-CM

## 2020-07-12 ENCOUNTER — Other Ambulatory Visit: Payer: Self-pay | Admitting: Cardiology

## 2020-08-04 ENCOUNTER — Other Ambulatory Visit: Payer: Self-pay | Admitting: Cardiology

## 2020-10-16 ENCOUNTER — Ambulatory Visit: Payer: 59 | Admitting: Cardiology

## 2020-10-29 ENCOUNTER — Other Ambulatory Visit (HOSPITAL_COMMUNITY): Payer: Self-pay | Admitting: Cardiology

## 2020-10-30 LAB — LIPID PANEL
Chol/HDL Ratio: 1.8 ratio (ref 0.0–5.0)
Cholesterol, Total: 160 mg/dL (ref 100–199)
HDL: 88 mg/dL (ref >39)
LDL Chol Calc (NIH): 49 mg/dL (ref 0–99)
Triglycerides: 139 mg/dL (ref 0–149)
VLDL Cholesterol Cal: 23 mg/dL (ref 5–40)

## 2020-11-01 ENCOUNTER — Encounter: Payer: Self-pay | Admitting: Cardiology

## 2020-11-01 ENCOUNTER — Other Ambulatory Visit: Payer: Self-pay

## 2020-11-01 ENCOUNTER — Ambulatory Visit: Payer: 59 | Admitting: Cardiology

## 2020-11-01 VITALS — BP 126/87 | HR 83 | Temp 97.8°F | Resp 16 | Ht 69.0 in | Wt 198.0 lb

## 2020-11-01 DIAGNOSIS — I10 Essential (primary) hypertension: Secondary | ICD-10-CM

## 2020-11-01 DIAGNOSIS — I2510 Atherosclerotic heart disease of native coronary artery without angina pectoris: Secondary | ICD-10-CM

## 2020-11-01 DIAGNOSIS — E782 Mixed hyperlipidemia: Secondary | ICD-10-CM

## 2020-11-01 NOTE — Progress Notes (Signed)
Follow up visit  Subjective:   Jeffrey Mcdaniel, male    DOB: [birth year], 55 y.o.   MRN: [id number]   Chief Complaint  Patient presents with   Coronary artery disease involving native coronary artery of   Hyperlipidemia   Follow-up    14 month    55 y/o Caucasian male with coronary artery disease, hypertension, controlled hyperlipidemia, family h/o premature CAD.  Patient is doing very well.  He continues to walk 4-5 miles 3-4 times a week without any complaints of chest pain, shortness of breath.  Blood pressure is well controlled.  He has had minimal leg swelling ever since he has been on amlodipine.  Lipids well controlled on Praluent.   Current Outpatient Medications on File Prior to Visit  Medication Sig Dispense Refill   amLODipine (NORVASC) 10 MG tablet TAKE 1 TABLET BY MOUTH EVERY DAY 90 tablet 3   aspirin EC 81 MG tablet Take 81 mg by mouth daily.     atorvastatin (LIPITOR) 40 MG tablet TAKE 1 TABLET BY MOUTH EVERY DAY 90 tablet 3   ezetimibe (ZETIA) 10 MG tablet TAKE 1 TABLET EVERY DAY 90 tablet 4   metoprolol succinate (TOPROL-XL) 50 MG 24 hr tablet TAKE 1 (ONE) TABLET ONCE DAILY IN THE EVENING 90 tablet 2   nitroGLYCERIN (NITROSTAT) 0.4 MG SL tablet Place 1 tablet (0.4 mg total) under the tongue every 5 (five) minutes as needed for chest pain. 30 tablet 2   Omeprazole 20 MG TBDD Take 20 mg by mouth daily.      PRALUENT 75 MG/ML SOAJ INJECT 1 ML INTO THE SKIN EVERY 14 (FOURTEEN) DAYS. 6 mL 3   tadalafil (CIALIS) 10 MG tablet as needed.     No current facility-administered medications on file prior to visit.    Cardiovascular studies:  EKG 11/01/2020: Sinus rhythm 71 bpm  RSR(V1) -nondiagnostic  Echocardiogram 02/18/2018: Left ventricle cavity is normal in size. Normal global wall motion. Normal diastolic filling pattern. Calculated EF 65%. Left atrial cavity is mildly dilated at 4.4 cm. Structurally normal mitral valve with trace regurgitation. Mild tricuspid  regurgitation. No evidence of pulmonary hypertension.  CT coronary 01/25/2018: 1. Calcium score 3277.  99th percentile for age and sex matched control. 2. Right dominant circulation. 3.  Moderate diffuse CAD.  At least moderate stenosis and mid RCA, proximal LAD, proximal ramus intermedius, and severe stenosis in occiput PDA.  Unable to process CT FFR as study is affected by motion.  Cardiac catheterization recommended.  Exercise Treadmill Stress Test 01/14/2018: Indication: FHx, palpitation, chest pressure The patient exercised on Bruce protocol for  10:00 min. Patient achieved  11.81 METS and reached HR  169 bpm, which is  99 % of maximum age-predicted HR.  Stress test terminated due to fatigue.   Exercise capacity was normal. HR Response to Exercise: Appropriate. BP Response to Exercise: Resting hypertension- appropriate response. Chest Pain: none. Arrhythmias: none. Resting EKG demonstrates sinus rhyth, incomplete RBBB. ST Changes: With peak exercise there were 1 mm horizontal ST depressions in lateral leads V2-V6, 1.5 downsloping ST depressions in lead V2, recovered within 2 min during recovery.  EKG changes are equivocal for ischemia.  Overall Impression: Equicoval stress test. Recommendations:  Consider nuclear sress test or coronary CT angiogram  Recent labs: 10/29/2020: Chol 160, TG 139, HDL 88, LDL 49.  04/13/2019: Chol 120, TG 89, HDL 63, LDL 40.  09/27/2019:  Cholesterol 187, triglycerides 146, HDL 86, LDL 77. Lipoprotein a 508.5  03/02/19: Cholesterol  199, triglycerides 106, HDL 98, LDL 83. Lipoprotein a 589  12/22/2017: H/H 13.7/44.2.  MCV 83.  Platelets 212.   Glucose 98.  BUN/creatinine 17/0.9.  eGFR 89/108.  Sodium 139, potassium 4.0.  Rest of the CMP normal Cholesterol 198, triglycerides 160, HDL 73, LDL 93. TSH 1.2 normal   Review of Systems  Cardiovascular:  Positive for leg swelling (Minimal, stable). Negative for chest pain, dyspnea on exertion,  palpitations and syncope.       Vitals:   11/01/20 1041  BP: 126/87  Pulse: 83  Resp: 16  Temp: 97.8 F (36.6 C)  SpO2: 99%     Objective:   Physical Exam Vitals and nursing note reviewed.  Constitutional:      Appearance: He is well-developed.  Neck:     Vascular: No JVD.  Cardiovascular:     Rate and Rhythm: Normal rate and regular rhythm.     Pulses: Intact distal pulses.     Heart sounds: Normal heart sounds. No murmur heard. Pulmonary:     Effort: Pulmonary effort is normal.     Breath sounds: Normal breath sounds. No wheezing or rales.  Musculoskeletal:     Right lower leg: Edema (Trace) present.     Left lower leg: Edema (Trace) present.        Assessment & Recommendations:   55 y/o Caucasian male with coronary artery disease without angina, hypertension, controlled hyperlipidemia, family h/o premature CAD.  CAD: Moderate nonobstructive. No symptoms of angina. Continue medical management with aspirin, statin + zetia, metoprolol and amlodipine. Lipids well controlled on Praluent.  Hypertension: Well controlled. Minimal leg edema on amlodipine is acceptable.  Encouraged using compression stockings.  Hyperlipidemia: Lipids well controlled on Praluent.  F/u in 1 year   [name], MD [facility] Pager: [phone] Office: [phone] If no answer Cell [phone]

## 2021-01-08 ENCOUNTER — Other Ambulatory Visit: Payer: Self-pay | Admitting: Cardiology

## 2021-01-08 DIAGNOSIS — I10 Essential (primary) hypertension: Secondary | ICD-10-CM

## 2021-04-16 ENCOUNTER — Other Ambulatory Visit: Payer: Self-pay | Admitting: Cardiology

## 2021-04-16 DIAGNOSIS — E7800 Pure hypercholesterolemia, unspecified: Secondary | ICD-10-CM

## 2021-07-12 ENCOUNTER — Other Ambulatory Visit: Payer: Self-pay | Admitting: Cardiology

## 2021-07-12 DIAGNOSIS — I25118 Atherosclerotic heart disease of native coronary artery with other forms of angina pectoris: Secondary | ICD-10-CM

## 2021-08-20 ENCOUNTER — Other Ambulatory Visit: Payer: Self-pay | Admitting: Cardiology

## 2021-10-03 ENCOUNTER — Telehealth: Payer: Self-pay

## 2021-10-03 NOTE — Telephone Encounter (Signed)
VM 1110 : Patient called and left a message regarding changes of the Repatha to Praluent.  Message is as follows:  "hello my name is Jeffrey Mcdaniel, I'm a patient there I recently got a notification that my injectable medicine is going to change to Repatha to Praluent I needed to get that called in in order to pick it up I'm out of my old medicine so if you could please look into that and if you have any questions please call me back my number is [phone] and again my name is Jeffrey Mcdaniel thank you bye."  Dawn tried calling patient, NA, LMAM.

## 2021-10-17 ENCOUNTER — Other Ambulatory Visit: Payer: Self-pay | Admitting: Cardiology

## 2021-10-17 DIAGNOSIS — I10 Essential (primary) hypertension: Secondary | ICD-10-CM

## 2021-10-20 ENCOUNTER — Telehealth: Payer: Self-pay

## 2021-10-20 NOTE — Telephone Encounter (Signed)
Patient called and states that he has been waiting on his prescription to be filled because his Prior Authorization was approved for Praulent injectable, called  his pharmacy and they said it was approved for Repatha, they just needed another prescription sent in for [place] .   Please advise

## 2021-10-21 ENCOUNTER — Other Ambulatory Visit: Payer: Self-pay

## 2021-10-21 MED ORDER — REPATHA SURECLICK 140 MG/ML ~~LOC~~ SOAJ: 140.0000 mL | SUBCUTANEOUS | 3 refills | Status: DC

## 2021-10-21 NOTE — Telephone Encounter (Signed)
Ok done

## 2021-10-29 ENCOUNTER — Other Ambulatory Visit: Payer: Self-pay | Admitting: Cardiology

## 2021-10-29 DIAGNOSIS — I2510 Atherosclerotic heart disease of native coronary artery without angina pectoris: Secondary | ICD-10-CM

## 2021-10-29 DIAGNOSIS — E7841 Elevated Lipoprotein(a): Secondary | ICD-10-CM

## 2021-10-29 DIAGNOSIS — E782 Mixed hyperlipidemia: Secondary | ICD-10-CM

## 2021-10-31 ENCOUNTER — Ambulatory Visit: Payer: 59 | Admitting: Cardiology

## 2021-12-01 ENCOUNTER — Encounter: Payer: Self-pay | Admitting: Cardiology

## 2021-12-01 ENCOUNTER — Ambulatory Visit: Payer: 59 | Admitting: Cardiology

## 2021-12-01 VITALS — BP 127/91 | HR 80 | Temp 98.7°F | Resp 16 | Ht 69.0 in | Wt 195.0 lb

## 2021-12-01 DIAGNOSIS — I10 Essential (primary) hypertension: Secondary | ICD-10-CM

## 2021-12-01 DIAGNOSIS — I2510 Atherosclerotic heart disease of native coronary artery without angina pectoris: Secondary | ICD-10-CM

## 2021-12-01 DIAGNOSIS — E782 Mixed hyperlipidemia: Secondary | ICD-10-CM

## 2021-12-01 NOTE — Progress Notes (Signed)
Follow up visit  Subjective:   Jeffrey Mcdaniel, male    DOB: 18-Jul-1965, 56 y.o.   MRN: [id number]   Chief Complaint  Patient presents with   Coronary Artery Disease   Follow-up    1 year    56 y/o Caucasian male with coronary artery disease, hypertension, controlled hyperlipidemia, family h/o premature CAD.  Patient had COVID last month. He still has residual congestion, but otherwise doing very well.  He continues to walk 4-5 miles 3-4 times a week without any complaints of chest pain, shortness of breath.  Blood pressure is well controlled.  He has upcoming labs with PCP next week.     Current Outpatient Medications:    amLODipine (NORVASC) 10 MG tablet, TAKE 1 TABLET BY MOUTH EVERY DAY, Disp: 90 tablet, Rfl: 3   aspirin EC 81 MG tablet, Take 81 mg by mouth daily., Disp: , Rfl:    atorvastatin (LIPITOR) 40 MG tablet, TAKE 1 TABLET BY MOUTH EVERY DAY, Disp: 90 tablet, Rfl: 3   Evolocumab (REPATHA SURECLICK) 115 MG/ML SOAJ, Inject 140 mLs into the skin every 14 (fourteen) days., Disp: 6 mL, Rfl: 3   ezetimibe (ZETIA) 10 MG tablet, TAKE 1 TABLET BY MOUTH EVERY DAY, Disp: 90 tablet, Rfl: 4   metoprolol succinate (TOPROL-XL) 50 MG 24 hr tablet, TAKE 1 (ONE) TABLET ONCE DAILY IN THE EVENING, Disp: 90 tablet, Rfl: 2   nitroGLYCERIN (NITROSTAT) 0.4 MG SL tablet, Place 1 tablet (0.4 mg total) under the tongue every 5 (five) minutes as needed for chest pain., Disp: 30 tablet, Rfl: 2   Omeprazole 20 MG TBDD, Take 20 mg by mouth daily. , Disp: , Rfl:    tadalafil (CIALIS) 10 MG tablet, as needed., Disp: , Rfl:   Cardiovascular studies:  EKG 12/01/2021: Sinus rhythm 87 bpm RSR(V1) -nondiagnostic Left atrial enlargement Nonspecific T-abnormality  Echocardiogram 02/18/2018: Left ventricle cavity is normal in size. Normal global wall motion. Normal diastolic filling pattern. Calculated EF 65%. Left atrial cavity is mildly dilated at 4.4 cm. Structurally normal mitral valve with trace  regurgitation. Mild tricuspid regurgitation. No evidence of pulmonary hypertension.  CT coronary 01/25/2018: 1. Calcium score 3277.  99th percentile for age and sex matched control. 2. Right dominant circulation. 3.  Moderate diffuse CAD.  At least moderate stenosis and mid RCA, proximal LAD, proximal ramus intermedius, and severe stenosis in occiput PDA.  Unable to process CT FFR as study is affected by motion.  Cardiac catheterization recommended.  Exercise Treadmill Stress Test 01/14/2018: Indication: FHx, palpitation, chest pressure The patient exercised on Bruce protocol for  10:00 min. Patient achieved  11.81 METS and reached HR  169 bpm, which is  99 % of maximum age-predicted HR.  Stress test terminated due to fatigue.   Exercise capacity was normal. HR Response to Exercise: Appropriate. BP Response to Exercise: Resting hypertension- appropriate response. Chest Pain: none. Arrhythmias: none. Resting EKG demonstrates sinus rhyth, incomplete RBBB. ST Changes: With peak exercise there were 1 mm horizontal ST depressions in lateral leads V2-V6, 1.5 downsloping ST depressions in lead V2, recovered within 2 min during recovery.  EKG changes are equivocal for ischemia.  Overall Impression: Equicoval stress test. Recommendations:  Consider nuclear sress test or coronary CT angiogram  Recent labs: 10/29/2020: Chol 160, TG 139, HDL 88, LDL 49.  04/13/2019: Chol 120, TG 89, HDL 63, LDL 40.  09/27/2019:  Cholesterol 187, triglycerides 146, HDL 86, LDL 77. Lipoprotein a 508.5  03/02/19: Cholesterol 199, triglycerides 106,  HDL 98, LDL 83. Lipoprotein a 589  12/22/2017: H/H 13.7/44.2.  MCV 83.  Platelets 212.   Glucose 98.  BUN/creatinine 17/0.9.  eGFR 89/108.  Sodium 139, potassium 4.0.  Rest of the CMP normal Cholesterol 198, triglycerides 160, HDL 73, LDL 93. TSH 1.2 normal   Review of Systems  Cardiovascular:  Negative for chest pain, dyspnea on exertion, leg swelling,  palpitations and syncope.        Vitals:   12/01/21 0924 12/01/21 0929  BP: (!) 136/92 (!) 127/91  Pulse: 60 80  Resp: 16   Temp: 98.7 F (37.1 C)   SpO2: 98%      Objective:   Physical Exam Vitals and nursing note reviewed.  Constitutional:      Appearance: He is well-developed.  Neck:     Vascular: No JVD.  Cardiovascular:     Rate and Rhythm: Normal rate and regular rhythm.     Pulses: Intact distal pulses.     Heart sounds: Normal heart sounds. No murmur heard. Pulmonary:     Effort: Pulmonary effort is normal.     Breath sounds: Normal breath sounds. No wheezing or rales.  Musculoskeletal:     Right lower leg: No edema.     Left lower leg: No edema.         Assessment & Recommendations:   56 y/o Caucasian male with coronary artery disease without angina, hypertension, controlled hyperlipidemia, family h/o premature CAD.  CAD: Moderate nonobstructive. No symptoms of angina. Continue medical management with aspirin, statin + zetia, metoprolol and amlodipine. Currently on Repatha. Repeat lipid panel with PCP next week. Discussed participation in [place] )a) clinical trial. Patient not interested at this time.   Hypertension: Well controlled.  Hyperlipidemia: As above.  F/u in 1 year    Jeffrey Mormon, MD Pager: [phone] Office: [phone]

## 2022-02-06 ENCOUNTER — Other Ambulatory Visit: Payer: Self-pay | Admitting: Cardiology

## 2022-02-06 DIAGNOSIS — E7800 Pure hypercholesterolemia, unspecified: Secondary | ICD-10-CM

## 2022-07-15 ENCOUNTER — Other Ambulatory Visit: Payer: Self-pay | Admitting: Cardiology

## 2022-07-15 DIAGNOSIS — I25118 Atherosclerotic heart disease of native coronary artery with other forms of angina pectoris: Secondary | ICD-10-CM

## 2022-07-15 DIAGNOSIS — I10 Essential (primary) hypertension: Secondary | ICD-10-CM

## 2022-07-27 ENCOUNTER — Other Ambulatory Visit: Payer: Self-pay

## 2022-07-27 MED ORDER — REPATHA SURECLICK 140 MG/ML ~~LOC~~ SOAJ: 140.0000 mg | SUBCUTANEOUS | 3 refills | Status: DC

## 2022-11-30 ENCOUNTER — Ambulatory Visit: Payer: 59

## 2022-11-30 ENCOUNTER — Encounter: Payer: Self-pay | Admitting: Cardiology

## 2022-11-30 ENCOUNTER — Other Ambulatory Visit: Payer: Self-pay | Admitting: *Deleted

## 2022-11-30 ENCOUNTER — Ambulatory Visit: Payer: 59 | Attending: Cardiology | Admitting: Cardiology

## 2022-11-30 VITALS — BP 134/78 | HR 95 | Resp 16 | Ht 69.0 in | Wt 204.8 lb

## 2022-11-30 DIAGNOSIS — I2510 Atherosclerotic heart disease of native coronary artery without angina pectoris: Secondary | ICD-10-CM | POA: Diagnosis not present

## 2022-11-30 DIAGNOSIS — I10 Essential (primary) hypertension: Secondary | ICD-10-CM | POA: Diagnosis not present

## 2022-11-30 DIAGNOSIS — E7841 Elevated Lipoprotein(a): Secondary | ICD-10-CM | POA: Diagnosis not present

## 2022-11-30 DIAGNOSIS — E782 Mixed hyperlipidemia: Secondary | ICD-10-CM

## 2022-11-30 MED ORDER — ATORVASTATIN CALCIUM 20 MG PO TABS: 20.0000 mg | ORAL_TABLET | Freq: Every day | ORAL | 2 refills | Status: AC

## 2022-11-30 NOTE — Progress Notes (Signed)
  Cardiology Office Note:  .   Date:  11/30/2022  ID:  Jeffrey Mcdaniel, DOB 11-27-1965, MRN [id number] PCP: Adrian Prince, MD  [facility] Providers Cardiologist:  Truett Mainland, MD PCP: Adrian Prince, MD  Chief Complaint  Patient presents with   Coronary artery disease involving native coronary artery of      History of Present Illness: .    Jeffrey Mcdaniel is a 57 y.o. male with coronary artery disease, hypertension, controlled hyperlipidemia, family h/o premature CAD.  Patient is doing well.  He is put up a few pounds since last visit.  He still staying very active with walking 3-1/2-4 miles 3-4 times a week without any symptoms of chest pain or shortness of breath.  LDL was 34 in 11/2021.  He had labs checked this morning, results not available to me.    Vitals:   11/30/22 0834  BP: 134/78  Pulse: 95  Resp: 16  SpO2: 98%     ROS:  Review of Systems  Cardiovascular:  Negative for chest pain, dyspnea on exertion, leg swelling, palpitations and syncope.     Studies Reviewed: Marland Kitchen       EKG 11/30/2022: Normal sinus rhythm Normal ECG No previous ECGs available   11/2021: LDL 34  03/2020: Lipoprotein 9a) 422    Physical Exam:   Physical Exam Vitals and nursing note reviewed.  Constitutional:      General: He is not in acute distress. Neck:     Vascular: No JVD.  Cardiovascular:     Rate and Rhythm: Normal rate and regular rhythm.     Heart sounds: Normal heart sounds. No murmur heard. Pulmonary:     Effort: Pulmonary effort is normal.     Breath sounds: Normal breath sounds. No wheezing or rales.  Musculoskeletal:     Right lower leg: No edema.     Left lower leg: No edema.      VISIT DIAGNOSES:   ICD-10-CM   1. Coronary artery disease involving native coronary artery of native heart without angina pectoris  I25.10 EKG 12-Lead    2. Mixed hyperlipidemia  E78.2     3. Essential (primary) hypertension  I10     4. 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    Jeffrey Mcdaniel is a 58 y.o. male with  coronary artery disease without angina, hypertension, controlled hyperlipidemia, family h/o premature CAD.   CAD: Moderate nonobstructive. No symptoms of angina. Continue medical management with aspirin, statin + zetia, metoprolol and amlodipine. Currently on Repatha, Lipitor 40 and Zetia. LDL 34 (11/2021) With elevated lipoprotein a, I would like him to be on the lowest necessary dose of statin.  With Repatha and Zetia on board, we can reduce his Lipitor dose to 20 mg daily.  I will repeat lipid panel in 3 months. In addition, he is interested in participating in lipoprotein a clinical trials.  For the specific reason, I will refer him to lipid clinic.    Hypertension: Well controlled.   Hyperlipidemia: As above.     F/u in 1 year  Signed, Elder Negus, MD

## 2022-11-30 NOTE — Patient Instructions (Signed)
Medication Instructions:   DECREASE YOUR ATORVASTATIN (LIPITOR) TO 20 MG BY MOUTH DAILY  *If you need a refill on your cardiac medications before your next appointment, please call your pharmacy*   You have been referred to SEE OUR PHARMACIST IN LIPID CLINIC FOR PATIENT IS INTERESTED IN LIPOPROTEIN TRIALS    Lab Work:  IN JANUARY 2025 HERE IN THE OFFICE--LIPIDS--PLEASE COME FASTING TO THIS LAB APPOINTMENT  If you have labs (blood work) drawn today and your tests are completely normal, you will receive your results only by: MyChart Message (if you have MyChart) OR A paper copy in the mail If you have any lab test that is abnormal or we need to change your treatment, we will call you to review the results.     Follow-Up: At [facility] you and your health needs are our priority.  As part of our continuing mission to provide you with exceptional heart care, we have created designated Provider Care Teams.  These Care Teams include your primary Cardiologist (physician) and Advanced Practice Providers (APPs -  Physician Assistants and Nurse Practitioners) who all work together to provide you with the care you need, when you need it.  We recommend signing up for the patient portal called "MyChart".  Sign up information is provided on this After Visit Summary.  MyChart is used to connect with patients for Virtual Visits (Telemedicine).  Patients are able to view lab/test results, encounter notes, upcoming appointments, etc.  Non-urgent messages can be sent to your provider as well.   To learn more about what you can do with MyChart, go to [email or web address].    Your next appointment:   1 year(s)  Provider:   DR. Rosemary Holms

## 2022-12-01 LAB — LIPID PANEL
Chol/HDL Ratio: 1.8 ratio (ref 0.0–5.0)
Cholesterol, Total: 169 mg/dL (ref 100–199)
HDL: 93 mg/dL (ref >39)
LDL Chol Calc (NIH): 56 mg/dL (ref 0–99)
Triglycerides: 117 mg/dL (ref 0–149)
VLDL Cholesterol Cal: 20 mg/dL (ref 5–40)

## 2022-12-01 LAB — LIPOPROTEIN A (LPA): Lipoprotein (a): 254.4 nmol/L — ABNORMAL HIGH (ref <75.0)

## 2022-12-02 ENCOUNTER — Ambulatory Visit: Payer: 59 | Admitting: Cardiology

## 2022-12-17 NOTE — Progress Notes (Deleted)
Patient ID: Jeffrey Mcdaniel                 DOB: December 26, 1965                    MRN: [id number]      HPI: Jeffrey Mcdaniel is a 57 y.o. male patient referred to lipid clinic by  Dr. Rosemary Holms. PMH is significant for CAD with elevated CAC of 3277 (99th percentile) in 2019, HTN, HLD, elevated Lp(a), family hx of premature CAD.   Last saw Dr. Rosemary Holms on 11/30/22 - reported to be doing well. Staying very active w/o CP or ShOB. LDL-C in 11/2021 was 34 mg/dL per note, on 32/95/18 LDL-C controlled at 56 mg/dL. Lp(a) was checked in 2022 - elevated to 422, now 254. Atorvastatin dose was reduced from 40 to 20 mg daily. Pt referred to lipid clinic to discuss Lp(a) clinical trials, as HLD is well-controlled per MD. Coronary CTA in 2018 demonstrated moderate diffuse CAD - moderate stenosis in mid RCA, prox LAD, prox ramus intermedius, and severe stenosis in prox PDA.   Today ***   Reviewed options for lowering LDL cholesterol, including ezetimibe, PCSK-9 inhibitors, bempedoic acid and inclisiran.  Discussed mechanisms of action, dosing, side effects and potential decreases in LDL cholesterol.  Also reviewed cost information and potential options for patient assistance.   Current Medications: evolocumab (Repatha) 140 mg subcutaneous every 2 weeks, ezetimibe 10 mg PO daily, atorvastatin 20 mg PO daily Intolerances: none Risk Factors: family hx premature CAD, elevated CAC with moderately diffuse CAD, HTN, elevated Lp(a) LDL-C goal: <55 mg/dL ApoB goal: <84 mg/dL  Diet:   Exercise:   Family History: Mother with heart disease, Father MI at 44,  Social History: Quit smoking in the 92s. Alcohol intake?   Labs: Lipid Panel     Component Value Date/Time   CHOL 169 11/30/2022 0755   TRIG 117 11/30/2022 0755   HDL 93 11/30/2022 0755   CHOLHDL 1.8 11/30/2022 0755   LDLCALC 56 11/30/2022 0755   LABVLDL 20 11/30/2022 0755    Past Medical History:  Diagnosis Date   Anemia    CAD (coronary artery  disease)    GERD (gastroesophageal reflux disease)    HTN (hypertension) 09/06/2018   Hyperlipidemia     Current Outpatient Medications on File Prior to Visit  Medication Sig Dispense Refill   amLODipine (NORVASC) 10 MG tablet TAKE 1 TABLET BY MOUTH EVERY DAY 90 tablet 3   aspirin EC 81 MG tablet Take 81 mg by mouth daily.     atorvastatin (LIPITOR) 20 MG tablet Take 1 tablet (20 mg total) by mouth daily. 90 tablet 2   Evolocumab (REPATHA SURECLICK) 140 MG/ML SOAJ Inject 140 mg into the skin every 14 (fourteen) days. 6 mL 3   ezetimibe (ZETIA) 10 MG tablet TAKE 1 TABLET BY MOUTH EVERY DAY 90 tablet 2   metoprolol succinate (TOPROL-XL) 50 MG 24 hr tablet TAKE 1 (ONE) TABLET ONCE DAILY IN THE EVENING 90 tablet 2   nitroGLYCERIN (NITROSTAT) 0.4 MG SL tablet Place 1 tablet (0.4 mg total) under the tongue every 5 (five) minutes as needed for chest pain. 30 tablet 2   Omeprazole 20 MG TBDD Take 20 mg by mouth daily.      tadalafil (CIALIS) 10 MG tablet as needed.     No current facility-administered medications on file prior to visit.    No Known Allergies  Assessment/Plan:  1. Hyperlipidemia -  No problem-specific  Assessment & Plan notes found for this encounter.    Thank you,   Nils Pyle, PharmD PGY1 Pharmacy Resident

## 2022-12-18 ENCOUNTER — Ambulatory Visit: Payer: 59

## 2022-12-24 ENCOUNTER — Ambulatory Visit: Payer: 59

## 2023-01-21 ENCOUNTER — Ambulatory Visit: Payer: 59

## 2023-01-21 NOTE — Progress Notes (Unsigned)
Patient ID: [name]                 DOB: 07/11/1965                    MRN: [id number]      HPI: Jeffrey Mcdaniel is a 57 y.o. male patient referred to lipid clinic by Dr. Rosemary Mcdaniel. PMH is significant for coronary artery disease, hypertension, controlled hyperlipidemia, family h/o premature CAD.    Reviewed options for lowering LDL cholesterol, including ezetimibe, PCSK-9 inhibitors, bempedoic acid and inclisiran.  Discussed mechanisms of action, dosing, side effects and potential decreases in LDL cholesterol.  Also reviewed cost information and potential options for patient assistance.  Current Medications:  Intolerances:  Risk Factors:  LDL-C goal:  ApoB goal:   Diet:   Exercise:   Family History:   Social History:   Labs: Lipid Panel     Component Value Date/Time   CHOL 169 11/30/2022 0755   TRIG 117 11/30/2022 0755   HDL 93 11/30/2022 0755   CHOLHDL 1.8 11/30/2022 0755   LDLCALC 56 11/30/2022 0755   LABVLDL 20 11/30/2022 0755    Past Medical History:  Diagnosis Date   Anemia    CAD (coronary artery disease)    GERD (gastroesophageal reflux disease)    HTN (hypertension) 09/06/2018   Hyperlipidemia     Current Outpatient Medications on File Prior to Visit  Medication Sig Dispense Refill   amLODipine (NORVASC) 10 MG tablet TAKE 1 TABLET BY MOUTH EVERY DAY 90 tablet 3   aspirin EC 81 MG tablet Take 81 mg by mouth daily.     atorvastatin (LIPITOR) 20 MG tablet Take 1 tablet (20 mg total) by mouth daily. 90 tablet 2   Evolocumab (REPATHA SURECLICK) 140 MG/ML SOAJ Inject 140 mg into the skin every 14 (fourteen) days. 6 mL 3   ezetimibe (ZETIA) 10 MG tablet TAKE 1 TABLET BY MOUTH EVERY DAY 90 tablet 2   metoprolol succinate (TOPROL-XL) 50 MG 24 hr tablet TAKE 1 (ONE) TABLET ONCE DAILY IN THE EVENING 90 tablet 2   nitroGLYCERIN (NITROSTAT) 0.4 MG SL tablet Place 1 tablet (0.4 mg total) under the tongue every 5 (five) minutes as needed for chest pain. 30 tablet 2    Omeprazole 20 MG TBDD Take 20 mg by mouth daily.      tadalafil (CIALIS) 10 MG tablet as needed.     No current facility-administered medications on file prior to visit.    No Known Allergies  Assessment/Plan:  1. Hyperlipidemia -  No problem-specific Assessment & Plan notes found for this encounter.    Thank you,  Jeffrey Mcdaniel, Pharm.D, BCACP, BCPS, CPP [facility] A Division of [facility] [address], [place], [place] [address]  Phone: [phone]; Fax: [phone]

## 2023-03-31 ENCOUNTER — Other Ambulatory Visit: Payer: Self-pay | Admitting: Cardiology

## 2023-03-31 DIAGNOSIS — I10 Essential (primary) hypertension: Secondary | ICD-10-CM

## 2023-03-31 DIAGNOSIS — I25118 Atherosclerotic heart disease of native coronary artery with other forms of angina pectoris: Secondary | ICD-10-CM

## 2023-04-15 ENCOUNTER — Ambulatory Visit: Payer: 59

## 2023-06-28 ENCOUNTER — Telehealth: Payer: Self-pay | Admitting: Pharmacy Technician

## 2023-06-28 NOTE — Telephone Encounter (Signed)
 Pharmacy Patient Advocate Encounter   Received notification from Fax that prior authorization for repatha  is required/requested.   Insurance verification completed.   The patient is insured through [employer] .   Per test claim: PA required; PA submitted to above mentioned insurance via CoverMyMeds Key/confirmation #/EOC faxed Status is pending

## 2023-06-30 ENCOUNTER — Other Ambulatory Visit (HOSPITAL_COMMUNITY): Payer: Self-pay

## 2023-07-01 ENCOUNTER — Other Ambulatory Visit (HOSPITAL_COMMUNITY): Payer: Self-pay

## 2023-07-01 NOTE — Telephone Encounter (Signed)
 Pharmacy Patient Advocate Encounter  Received notification from [employer] [facility] that Prior Authorization for Repatha  has been APPROVED from 07/01/23 to 06/30/24. Unable to obtain price due to refill too soon rejection, last fill date 06/29/23 next available fill date07/23/25 /  PA #/Case ID/Reference #: [id number]

## 2023-09-20 ENCOUNTER — Other Ambulatory Visit: Payer: Self-pay | Admitting: Cardiology

## 2023-09-20 DIAGNOSIS — E782 Mixed hyperlipidemia: Secondary | ICD-10-CM

## 2023-09-20 DIAGNOSIS — I2510 Atherosclerotic heart disease of native coronary artery without angina pectoris: Secondary | ICD-10-CM

## 2023-09-20 DIAGNOSIS — E7841 Elevated Lipoprotein(a): Secondary | ICD-10-CM

## 2023-09-27 ENCOUNTER — Other Ambulatory Visit: Payer: Self-pay | Admitting: Cardiology

## 2023-09-27 DIAGNOSIS — I25118 Atherosclerotic heart disease of native coronary artery with other forms of angina pectoris: Secondary | ICD-10-CM

## 2023-09-27 DIAGNOSIS — I10 Essential (primary) hypertension: Secondary | ICD-10-CM

## 2024-03-14 ENCOUNTER — Telehealth: Payer: Self-pay | Admitting: Cardiology

## 2024-03-15 MED ORDER — REPATHA SURECLICK 140 MG/ML ~~LOC~~ SOAJ: 140.0000 mg | SUBCUTANEOUS | 3 refills | Status: AC

## 2024-04-27 ENCOUNTER — Ambulatory Visit: Admitting: Cardiology
